# Patient Record
Sex: Female | Born: 1995 | Race: Black or African American | Hispanic: No | Marital: Single | Smoking: Current every day smoker
Health system: Southern US, Community
[De-identification: ages and names within clinical notes are randomized; demographics above are authoritative.]

## PROBLEM LIST (undated history)

## (undated) ENCOUNTER — Inpatient Hospital Stay (HOSPITAL_COMMUNITY): Payer: Self-pay

## (undated) DIAGNOSIS — D649 Anemia, unspecified: Secondary | ICD-10-CM

## (undated) DIAGNOSIS — Z720 Tobacco use: Secondary | ICD-10-CM

## (undated) DIAGNOSIS — T7840XA Allergy, unspecified, initial encounter: Secondary | ICD-10-CM

## (undated) DIAGNOSIS — Z789 Other specified health status: Secondary | ICD-10-CM

## (undated) DIAGNOSIS — L309 Dermatitis, unspecified: Secondary | ICD-10-CM

## (undated) DIAGNOSIS — Z91018 Allergy to other foods: Secondary | ICD-10-CM

## (undated) HISTORY — DX: Dermatitis, unspecified: L30.9

## (undated) HISTORY — DX: Allergy, unspecified, initial encounter: T78.40XA

## (undated) HISTORY — DX: Tobacco use: Z72.0

## (undated) HISTORY — PX: NO PAST SURGERIES: SHX2092

## (undated) HISTORY — DX: Anemia, unspecified: D64.9

## (undated) HISTORY — DX: Allergy to other foods: Z91.018

---

## 2013-07-23 ENCOUNTER — Encounter (HOSPITAL_COMMUNITY): Payer: Self-pay | Admitting: Emergency Medicine

## 2013-07-23 ENCOUNTER — Emergency Department (HOSPITAL_COMMUNITY)
Admission: EM | Admit: 2013-07-23 | Discharge: 2013-07-23 | Disposition: A | Discharge status: Home / Self Care | Payer: Medicaid - Out of State | Attending: Emergency Medicine | Admitting: Emergency Medicine

## 2013-07-23 DIAGNOSIS — R6884 Jaw pain: Secondary | ICD-10-CM | POA: Insufficient documentation

## 2013-07-23 DIAGNOSIS — M26609 Unspecified temporomandibular joint disorder, unspecified side: Secondary | ICD-10-CM | POA: Insufficient documentation

## 2013-07-23 MED ORDER — IBUPROFEN 800 MG PO TABS: 800.0000 mg | ORAL_TABLET | Freq: Three times a day (TID) | ORAL | Status: DC

## 2013-07-23 NOTE — ED Notes (Signed)
Pt ambulatory to exam room with steady gait. Pt with no acute distress.  

## 2013-07-23 NOTE — Discharge Instructions (Signed)
Temporomandibular Problems  Temporomandibular joint (TMJ) dysfunction means there are problems with the joint between your jaw and your skull. This is a joint lined by cartilage like other joints in your body but also has a small disc in the joint which keeps the bones from rubbing on each other. These joints are like other joints and can get inflamed (sore) from arthritis and other problems. When this joint gets sore, it can cause headaches and pain in the jaw and the face. CAUSES  Usually the arthritic types of problems are caused by soreness in the joint. Soreness in the joint can also be caused by overuse. This may come from grinding your teeth. It may also come from mis-alignment in the joint. DIAGNOSIS Diagnosis of this condition can often be made by history and exam. Sometimes your caregiver may need X-rays or an MRI scan to determine the exact cause. It may be necessary to see your dentist to determine if your teeth and jaws are lined up correctly. TREATMENT  Most of the time this problem is not serious; however, sometimes it can persist (become chronic). When this happens medications that will cut down on inflammation (soreness) help. Sometimes a shot of cortisone into the joint will be helpful. If your teeth are not aligned it may help for your dentist to make a splint for your mouth that can help this problem. If no physical problems can be found, the problem may come from tension. If tension is found to be the cause, biofeedback or relaxation techniques may be helpful. HOME CARE INSTRUCTIONS   Later in the day, applications of ice packs may be helpful. Ice can be used in a plastic bag with a towel around it to prevent frostbite to skin. This may be used about every 2 hours for 20 to 30 minutes, as needed while awake, or as directed by your caregiver.  Only take over-the-counter or prescription medicines for pain, discomfort, or fever as directed by your caregiver.  If physical therapy was  prescribed, follow your caregiver's directions.  Wear mouth appliances as directed if they were given. Document Released: 03/19/2001 Document Revised: 09/16/2011 Document Reviewed: 06/26/2008 [facility] Patient Information 2014 [place], [place].   Emergency Department Resource Guide 1) Find a Doctor and Pay Out of Pocket Although you won't have to find out who is covered by your insurance plan, it is a good idea to ask around and get recommendations. You will then need to call the office and see if the doctor you have chosen will accept you as a new patient and what types of options they offer for patients who are self-pay. Some doctors offer discounts or will set up payment plans for their patients who do not have insurance, but you will need to ask so you aren't surprised when you get to your appointment.  2) Contact Your Local Health Department Not all health departments have doctors that can see patients for sick visits, but many do, so it is worth a call to see if yours does. If you don't know where your local health department is, you can check in your phone book. The CDC also has a tool to help you locate your state's health department, and many state websites also have listings of all of their local health departments.  3) Find a Walk-in Clinic If your illness is not likely to be very severe or complicated, you may want to try a walk in clinic. These are popping up all over the country in pharmacies,  drugstores, and shopping centers. They're usually staffed by nurse practitioners or physician assistants that have been trained to treat common illnesses and complaints. They're usually fairly quick and inexpensive. However, if you have serious medical issues or chronic medical problems, these are probably not your best option.  No Primary Care Doctor: - Call Health Connect at  [phone] - they can help you locate a primary care doctor that  accepts your insurance, provides certain services,  etc. - Physician Referral Service- [phone]  Chronic Pain Problems: Organization         Address  Phone   Notes  [facility]  [phone] Patients need to be referred by their primary care doctor.   Medication Assistance: Organization         Address  Phone   Notes  [facility] Medication [facility] [address] [place], [place] [address] [phone] --Must be a resident of [facility] -- Must have NO insurance coverage whatsoever (no Medicaid/ Medicare, etc.) -- The pt. MUST have a primary care doctor that directs their care regularly and follows them in the community   [facility]  [phone]   [employer]  [phone]    Agencies that provide inexpensive medical care: Organization         Address  Phone   Notes  [facility]  [phone]   [facility]    [phone]   [facility] [address] [place], [place] [address] [phone]   [facility] [address] [place], [place] [phone]   Planned Parenthood    [phone]   [facility]    [phone]   [facility]  [address], [place] Phone:  [phone], Fax:  [phone] Hours of Operation:  9 am - 6 pm, M-F.  Also accepts Medicaid/Medicare and self-pay.  [facility]  [address], [place] Phone: [phone], Fax: [phone]. Hours of Operation:  8:30 am - 5:30 pm, M-F.  Also accepts Medicaid and self-pay.  [facility] [place] [address] [place] Phone: [phone]   [facility] [address] Natasha Bence [place], [place] [phone], Ext. [phone] Mondays & Thursdays: 7-9 AM.  First 15 patients are seen on a first come, first serve basis.    Medicaid-accepting [facility] Providers:  Organization         Address  Phone   Notes  [facility] [address],  [phone] Also accepts self-pay patients.  [facility] [address] Laurell Josephs [place], [place]  [phone]   [facility] [address], [place] [phone]   [facility] [address] [place] [phone]   Renaye Rakers [address], [place]   [phone] Only accepts [place] patients after they have their name applied to their card.   Self-Pay (no insurance) in [facility]:  Organization         Address  Phone   Notes  Sickle Cell Patients, [facility] [address] [place], [place] [phone]   [facility] [address] [place], [place] [phone]   [facility] [place]  [address], [place] [phone]   [facility]/Dr. Osei-Bonsu  [address], [place] or [address], [place] [phone] Phone number for both [employer] and [place] locations is the same.  [facility] [address] [place] [phone]   [facility] [address], [place] or [address] [phone]   [facility] [address] [place] [phone], phone; [phone], fax Sees patients 1st and 3rd Saturday of every month.  Must not qualify for public or private insurance (i.e. Medicaid, Medicare, [place] Health Choice, Veterans' Benefits)  Household income should be no more than 200% of the poverty level The clinic cannot treat you if you are pregnant or think you are pregnant  Sexually transmitted diseases are not treated at the clinic.    Dental Care: Organization         Address  Phone  Notes  [facility] Department of [facility] [address] [place], [place] [phone] Accepts children up to  age 76 who are enrolled in [place] or [place] Health Choice; pregnant women with a Medicaid card; and children who have applied for Medicaid or [place] Health Choice, but were declined, whose parents can pay a reduced fee at time of service.  [facility]  [address], [place] [phone] Accepts children up to age 26 who are enrolled in [place] or [place] Health Choice; pregnant women with a Medicaid card; and children who have applied for Medicaid or [place] Health Choice, but were declined, whose parents can pay a reduced fee at time of service.  [facility]  [address] [place], [place] [phone] Patients are seen by appointment only. Walk-ins are not accepted. [facility] will see patients 66 years of age and older. Monday - Tuesday (8am-5pm) Most Wednesdays (8:30-5pm) $30 per visit, cash only  [facility]  [address], [facility] [phone] Patients are seen by appointment only. Walk-ins are not accepted. [facility] will see patients 25 years of age and older. One Wednesday Evening (Monthly: Volunteer Based).  $30 per visit, cash only  [employer]  [phone] for adults; Children under age 32, call Graduate Pediatric Dentistry at [phone]. Children aged 49-14, please call [phone] to request a pediatric application.  Dental services are provided in all areas of dental care including fillings, crowns and bridges, complete and partial dentures, implants, gum treatment, root canals, and extractions. Preventive care is also provided. Treatment is provided to both adults and children. Patients are selected via a lottery and there is often a waiting list.   [facility] [address] [place]  [phone] [email or web address]   [facility] [address] [place], [place] [phone], Ext. [phone] Second and Fourth Thursday of  each month, opens at 6:30 AM; Clinic ends at 9 AM.  Patients are seen on a first-come first-served basis, and a limited number are seen during each clinic.   [facility]  [address] Ether Griffins [place], [place] [phone]   Eligibility Requirements You must have lived in [place], [place], or [place] counties for at least the last three months.   You cannot be eligible for state or federal sponsored [place], including [employer], [place], or [employer].   You generally cannot be  eligible for healthcare insurance through your employer.    How to apply: Eligibility screenings are held every Tuesday and Wednesday afternoon from 1:00 pm until 4:00 pm. You do not need an appointment for the interview!  [facility] [address], [place], [place] [phone]   [facility]  [phone]   [facility]  [phone]   [facility]  [phone]    Behavioral Health Resources in the Community: Intensive Outpatient Programs Organization         Address  Phone  Notes  [facility] [address], [place], [place] [phone]   [facility] [address], [place], [place] [phone]   [facility] [address], [place], [place]  [phone]   [facility] [address],  [place], [place] [phone] or [phone]   Substance Abuse Resources Organization         Address  Phone  Notes  [facility]  [phone]   [facility]  [phone]   [facility]  [phone]   [facility]  [phone]   Residential & Outpatient Substance Abuse Program  [phone]   Psychological Services Organization         Address  Phone  Notes  [facility]  [phone]- [phone]   [facility]  [phone]- [phone]   [facility] [address],  [place] [phone] or [phone]    Mobile Crisis Teams Organization         Address  Phone  Notes  [facility], Mobile Crisis Care Unit  [phone]   [facility]  [address] [place], [place] [phone]   Doristine LocksSharon DeEsch [address] [place] [phone]    Self-Help/Support Groups Organization         Address  Phone             Notes  [facility] [place] - variety of support groups  336- [phone] Call for more information  Narcotics Anonymous (NA), [facility] [address], [place]  2 meetings at this location   Statisticianesidential Treatment Programs Organization         Address  Phone  Notes  [facility] [address],    [place]  [phone]   [facility]  [address], [place] [phone], [place], [place] [phone]   [facility] [address], [place] [phone] Admissions: 8am-3pm M-F  [facility] [address]    [place], [place] [phone]   [facility] [address], [place], [place] [phone]   [facility] [address]  [place], [place] [phone]   [facility] - Intensive Outpatient [address], [place], [place] [phone]   [facility] ([facility] [address]  [place], [place] [phone] or [phone]   [facility] ([facility]) [address] [place], [place] [phone] Accepts Medicaid  [facility] [address]  [place] [phone] Substance Abuse/Addiction Treatment   [facility] Resources Organization         Address  Phone  Notes  [facility]  [phone](888) [phone]   Angie FavaJulie Brannon, PhD [address] [place], [place]   [phone] or [phone]   [facility]   [address] [place],  [place] [phone]     [facility] [address], [place], [place] [phone] Insurance/Medicaid/sponsorship through [employer] and Families [address]                                    [place], [place] [phone] Therapy/tele-psych/case  [facility] [address]   [place], [place] [phone]    Dr. Lolly Mustache  [phone]   [facility] [place]  [employer] [facility] Dept. 1) [address], [place] 2) [address], [place] 3)  [address], [place] [phone]  [phone]   [facility] Child Abuse Hotline [phone] or [phone] (After Hours)

## 2013-07-23 NOTE — ED Notes (Signed)
Per pt, states she has  medicaid and un able to go to Dentist/MD to have symptoms looked at-states jaw has been locking up for 3 months

## 2013-07-23 NOTE — ED Provider Notes (Signed)
CSN: [id number]     Arrival date & time 07/23/13  1553 History   First MD Initiated Contact with Patient 07/23/13 1655     Chief Complaint  Patient presents with  . jaw locks up    (Consider location/radiation/quality/duration/timing/severity/associated sxs/prior Treatment) HPI  Pt is a 18 yo female with complaints of her jaw locking up, causing pain on the left side of her jaw.  She states that it most often occurs when she yawns or is eating, and occasionally wakes up with it like that.  She denies any fever, chills, bowel or bladder changes, and states her LMP was 3 weeks ago.  She states she would have gone to a dentist, but [place] from [place], and has applied for Medicaid in [place], but has yet to hear back.  She also reports intermittent headaches, but states she does not wear her glasses or retainer.  History reviewed. No pertinent past medical history. No past surgical history on file. No family history on file. History  Substance Use Topics  . Smoking status: Never Smoker   . Smokeless tobacco: Not on file  . Alcohol Use: No   OB History   Grav Para Term Preterm Abortions TAB SAB Ect Mult Living                 Review of Systems  Constitutional: Negative for fever.  Skin: Negative for rash and wound.  Neurological: Negative for speech difficulty and numbness.    Allergies  Fish allergy and Shellfish allergy  Home Medications   Current Outpatient Rx  Name  Route  Sig  Dispense  Refill  . norelgestromin-ethinyl estradiol (ORTHO EVRA) 150-35 MCG/24HR transdermal patch   Transdermal   Place 1 patch onto the skin once a week.          BP 106/66  Pulse 82  Temp(Src) 98.6 F (37 C) (Oral)  Resp 16  SpO2 98%  LMP 07/03/2013 Physical Exam  General:  WDWN in NAD Psych:  Normal Mood and Affect Ears:  Mild Cerumen, TMs clear. Mouth:  Non erythematous, non edematous, uvula midline, no exudate present. Denied tenderness in teeth 1-32.  No obvious  caries. Jaw:  Obvious clicking and mild tenderness at Left TMJ.  Audible "pop" when patient yawns. No malocclusion.   ED Course  Procedures (including critical care time)  TMJ pain without trauma: capsule inflammation due to bite problems & muscle spasm Treatment: NSAIDs, soft diet, warm compresses Disposition: home with dental or oral surgery F/U  Labs Review Labs Reviewed - No data to display Imaging Review No results found.  EKG Interpretation   None       MDM   1. TMJ (temporomandibular joint disorder)    BP 106/66  Pulse 82  Temp(Src) 98.6 F (37 C) (Oral)  Resp 16  SpO2 98%  LMP 07/03/2013     Fayrene HelperBowie Saniah Schroeter, PA-C 07/23/13 1747

## 2013-07-24 NOTE — ED Provider Notes (Signed)
Medical screening examination/treatment/procedure(s) were performed by non-physician practitioner and as supervising physician I was immediately available for consultation/collaboration.  EKG Interpretation   None         Loys Shugars Y. Marquinn Meschke, MD 07/24/13 1321 

## 2013-10-06 ENCOUNTER — Encounter (HOSPITAL_COMMUNITY): Payer: Self-pay | Admitting: Emergency Medicine

## 2013-10-06 ENCOUNTER — Emergency Department (HOSPITAL_COMMUNITY)
Admission: EM | Admit: 2013-10-06 | Discharge: 2013-10-06 | Disposition: A | Discharge status: Home / Self Care | Payer: Medicaid - Out of State | Attending: Emergency Medicine | Admitting: Emergency Medicine

## 2013-10-06 DIAGNOSIS — K2970 Gastritis, unspecified, without bleeding: Secondary | ICD-10-CM | POA: Insufficient documentation

## 2013-10-06 DIAGNOSIS — R112 Nausea with vomiting, unspecified: Secondary | ICD-10-CM | POA: Insufficient documentation

## 2013-10-06 DIAGNOSIS — Z3202 Encounter for pregnancy test, result negative: Secondary | ICD-10-CM | POA: Insufficient documentation

## 2013-10-06 DIAGNOSIS — Z79899 Other long term (current) drug therapy: Secondary | ICD-10-CM | POA: Insufficient documentation

## 2013-10-06 DIAGNOSIS — K2990 Gastroduodenitis, unspecified, without bleeding: Principal | ICD-10-CM

## 2013-10-06 DIAGNOSIS — R109 Unspecified abdominal pain: Secondary | ICD-10-CM

## 2013-10-06 LAB — LIPASE, BLOOD: LIPASE: 19 U/L (ref 11–59)

## 2013-10-06 LAB — COMPREHENSIVE METABOLIC PANEL
ALT: 12 U/L (ref 0–35)
AST: 18 U/L (ref 0–37)
Albumin: 3.6 g/dL (ref 3.5–5.2)
Alkaline Phosphatase: 68 U/L (ref 39–117)
BUN: 9 mg/dL (ref 6–23)
CALCIUM: 9.5 mg/dL (ref 8.4–10.5)
CO2: 23 mEq/L (ref 19–32)
Chloride: 105 mEq/L (ref 96–112)
Creatinine, Ser: 0.67 mg/dL (ref 0.50–1.10)
GFR calc Af Amer: >90 mL/min (ref >90)
GFR calc non Af Amer: >90 mL/min (ref >90)
Glucose, Bld: 89 mg/dL (ref 70–99)
Potassium: 4 mEq/L (ref 3.7–5.3)
SODIUM: 139 meq/L (ref 137–147)
TOTAL PROTEIN: 7.3 g/dL (ref 6.0–8.3)
Total Bilirubin: 0.7 mg/dL (ref 0.3–1.2)

## 2013-10-06 LAB — CBC WITH DIFFERENTIAL/PLATELET
BASOS ABS: 0 10*3/uL (ref 0.0–0.1)
BASOS PCT: 0 % (ref 0–1)
EOS ABS: 0.2 10*3/uL (ref 0.0–0.7)
EOS PCT: 5 % (ref 0–5)
HCT: 37.5 % (ref 36.0–46.0)
Hemoglobin: 12.7 g/dL (ref 12.0–15.0)
LYMPHS ABS: 1.8 10*3/uL (ref 0.7–4.0)
Lymphocytes Relative: 40 % (ref 12–46)
MCH: 29.2 pg (ref 26.0–34.0)
MCHC: 33.9 g/dL (ref 30.0–36.0)
MCV: 86.2 fL (ref 78.0–100.0)
Monocytes Absolute: 0.3 10*3/uL (ref 0.1–1.0)
Monocytes Relative: 6 % (ref 3–12)
NEUTROS PCT: 49 % (ref 43–77)
Neutro Abs: 2.3 10*3/uL (ref 1.7–7.7)
PLATELETS: 210 10*3/uL (ref 150–400)
RBC: 4.35 MIL/uL (ref 3.87–5.11)
RDW: 12.7 % (ref 11.5–15.5)
WBC: 4.6 10*3/uL (ref 4.0–10.5)

## 2013-10-06 LAB — URINALYSIS, ROUTINE W REFLEX MICROSCOPIC
Bilirubin Urine: NEGATIVE
Glucose, UA: NEGATIVE mg/dL
Hgb urine dipstick: NEGATIVE
Ketones, ur: NEGATIVE mg/dL
Leukocytes, UA: NEGATIVE
Nitrite: NEGATIVE
Protein, ur: NEGATIVE mg/dL
SPECIFIC GRAVITY, URINE: 1.022 (ref 1.005–1.030)
UROBILINOGEN UA: 0.2 mg/dL (ref 0.0–1.0)
pH: 7 (ref 5.0–8.0)

## 2013-10-06 LAB — PREGNANCY, URINE: Preg Test, Ur: NEGATIVE

## 2013-10-06 MED ORDER — ONDANSETRON 4 MG PO TBDP
4.0000 mg | ORAL_TABLET | Freq: Once | ORAL | Status: AC
  Administered 2013-10-06: 4 mg via ORAL
  Filled 2013-10-06: qty 1

## 2013-10-06 MED ORDER — GI COCKTAIL ~~LOC~~
30.0000 mL | Freq: Once | ORAL | Status: AC
  Administered 2013-10-06: 30 mL via ORAL
  Filled 2013-10-06: qty 30

## 2013-10-06 MED ORDER — FAMOTIDINE 20 MG PO TABS: 20.0000 mg | ORAL_TABLET | Freq: Once | ORAL | Status: DC

## 2013-10-06 NOTE — ED Provider Notes (Signed)
CSN: [id number]     Arrival date & time 10/06/13  1200 History   First MD Initiated Contact with Patient 10/06/13 1212     Chief Complaint  Patient presents with  . Abdominal Pain     (Consider location/radiation/quality/duration/timing/severity/associated sxs/prior Treatment) HPI Comments: 18 y/o healthy female presents to the ED complaining of gradual onset abdominal pain beginning yesterday with associated nausea and two episodes of non-bloody emesis. Pain has been constant, cramping, located mid-epigastric, non-radiating. She has not tried any alleviating factors. Denies fever, chills, diarrhea. No sick contacts. She ate fettuccini alfredo last night and states she vomited it up this morning. LMP was 1 week ago and normal. Denies urinary symptoms, vaginal bleeding or discharge.  Patient is a 18 y.o. female presenting with abdominal pain. The history is provided by the patient.  Abdominal Pain Associated symptoms: nausea and vomiting     History reviewed. No pertinent past medical history. History reviewed. No pertinent past surgical history. History reviewed. No pertinent family history. History  Substance Use Topics  . Smoking status: Never Smoker   . Smokeless tobacco: Not on file  . Alcohol Use: No   OB History   Grav Para Term Preterm Abortions TAB SAB Ect Mult Living                 Review of Systems  Gastrointestinal: Positive for nausea, vomiting and abdominal pain.  All other systems reviewed and are negative.      Allergies  Fish allergy; Shellfish allergy; and Peanut-containing drug products  Home Medications   Current Outpatient Rx  Name  Route  Sig  Dispense  Refill  . norelgestromin-ethinyl estradiol (ORTHO EVRA) 150-35 MCG/24HR transdermal patch   Transdermal   Place 1 patch onto the skin once a week. Wednesday          BP 121/74  Pulse 82  Temp(Src) 98.5 F (36.9 C) (Oral)  Resp 18  SpO2 99%  LMP 09/23/2013 Physical Exam  Nursing note and  vitals reviewed. Constitutional: She is oriented to person, place, and time. She appears well-developed and well-nourished. No distress.  HENT:  Head: Normocephalic and atraumatic.  Mouth/Throat: Oropharynx is clear and moist.  Eyes: Conjunctivae are normal. No scleral icterus.  Neck: Normal range of motion. Neck supple.  Cardiovascular: Normal rate, regular rhythm and normal heart sounds.   Pulmonary/Chest: Effort normal and breath sounds normal.  Abdominal: Soft. Normal appearance and bowel sounds are normal. She exhibits no distension. There is no splenomegaly. There is tenderness in the epigastric area and left upper quadrant. There is no rigidity, no rebound and no guarding.  No peritoneal signs.  Musculoskeletal: Normal range of motion. She exhibits no edema.  Neurological: She is alert and oriented to person, place, and time.  Skin: Skin is warm and dry. She is not diaphoretic.  Psychiatric: She has a normal mood and affect. Her behavior is normal.    ED Course  Procedures (including critical care time) Labs Review Labs Reviewed  CBC WITH DIFFERENTIAL  COMPREHENSIVE METABOLIC PANEL  PREGNANCY, URINE  URINALYSIS, ROUTINE W REFLEX MICROSCOPIC  LIPASE, BLOOD   Imaging Review No results found.   EKG Interpretation None      MDM   Final diagnoses:  Gastritis  Abdominal pain   Pt presenting with abdominal pain, n/v. She is well appearing and in NAD, afebrile, normal VS. No peritoneal signs. Labs pending. Plan to give GI cocktail and zofran and re-assess. 1:50 PM Labs normal. Pt reports her  abdominal pain is present, however much improved. Nausea has subsided. Tolerates PO. Repeat abdominal exam abdomen still tender mid-epigastric, marked improvement from initial exam. Stable for d/c. She does not want prescription as her medicaid [place] out of state. Advised OTC prilosec or nexium. Return precautions given. Patient states understanding of treatment care plan and is  agreeable.   Trevor Mace, PA-C 10/06/13 1351

## 2013-10-06 NOTE — ED Notes (Signed)
Per pt, started having abdominal pain yesterday.  Pt states vomited x2 this am.  No diarrhea.  No fever.  Headache.

## 2013-10-06 NOTE — ED Notes (Signed)
Bed: WA03 Expected date:  Expected time:  Means of arrival:  Comments: 

## 2013-10-06 NOTE — ED Provider Notes (Signed)
Medical screening examination/treatment/procedure(s) were performed by non-physician practitioner and as supervising physician I was immediately available for consultation/collaboration.   Celene KrasJon R Deniesha Stenglein, MD 10/06/13 [phone]

## 2013-10-06 NOTE — Discharge Instructions (Signed)
Take over-the-counter prilosec or nexium for your pain. Follow up with one of the resources below to establish care with a primary care doctor.  Gastritis, Adult Gastritis is soreness and swelling (inflammation) of the lining of the stomach. Gastritis can develop as a sudden onset (acute) or long-term (chronic) condition. If gastritis is not treated, it can lead to stomach bleeding and ulcers. CAUSES  Gastritis occurs when the stomach lining is weak or damaged. Digestive juices from the stomach then inflame the weakened stomach lining. The stomach lining may be weak or damaged due to viral or bacterial infections. One common bacterial infection is the Helicobacter pylori infection. Gastritis can also result from excessive alcohol consumption, taking certain medicines, or having too much acid in the stomach.  SYMPTOMS  In some cases, there are no symptoms. When symptoms are present, they may include:  Pain or a burning sensation in the upper abdomen.  Nausea.  Vomiting.  An uncomfortable feeling of fullness after eating. DIAGNOSIS  Your caregiver may suspect you have gastritis based on your symptoms and a physical exam. To determine the cause of your gastritis, your caregiver may perform the following:  Blood or stool tests to check for the H pylori bacterium.  Gastroscopy. A thin, flexible tube (endoscope) is passed down the esophagus and into the stomach. The endoscope has a light and camera on the end. Your caregiver uses the endoscope to view the inside of the stomach.  Taking a tissue sample (biopsy) from the stomach to examine under a microscope. TREATMENT  Depending on the cause of your gastritis, medicines may be prescribed. If you have a bacterial infection, such as an H pylori infection, antibiotics may be given. If your gastritis is caused by too much acid in the stomach, H2 blockers or antacids may be given. Your caregiver may recommend that you stop taking aspirin, ibuprofen, or  other nonsteroidal anti-inflammatory drugs (NSAIDs). HOME CARE INSTRUCTIONS  Only take over-the-counter or prescription medicines as directed by your caregiver.  If you were given antibiotic medicines, take them as directed. Finish them even if you start to feel better.  Drink enough fluids to keep your urine clear or pale yellow.  Avoid foods and drinks that make your symptoms worse, such as:  Caffeine or alcoholic drinks.  Chocolate.  Peppermint or mint flavorings.  Garlic and onions.  Spicy foods.  Citrus fruits, such as oranges, lemons, or limes.  Tomato-based foods such as sauce, chili, salsa, and pizza.  Fried and fatty foods.  Eat small, frequent meals instead of large meals. SEEK IMMEDIATE MEDICAL CARE IF:   You have black or dark red stools.  You vomit blood or material that looks like coffee grounds.  You are unable to keep fluids down.  Your abdominal pain gets worse.  You have a fever.  You do not feel better after 1 week.  You have any other questions or concerns. MAKE SURE YOU:  Understand these instructions.  Will watch your condition.  Will get help right away if you are not doing well or get worse. Document Released: 06/18/2001 Document Revised: 12/24/2011 Document Reviewed: 08/07/2011 [facility] Patient Information 2014 [place], [place].  Abdominal Pain, Women Abdominal (stomach, pelvic, or belly) pain can be caused by many things. It is important to tell your doctor:  The location of the pain.  Does it come and go or is it present all the time?  Are there things that start the pain (eating certain foods, exercise)?  Are there other symptoms  associated with the pain (fever, nausea, vomiting, diarrhea)? All of this is helpful to know when trying to find the cause of the pain. CAUSES   Stomach: virus or bacteria infection, or ulcer.  Intestine: appendicitis (inflamed appendix), regional ileitis (Crohn's disease), ulcerative colitis  (inflamed colon), irritable bowel syndrome, diverticulitis (inflamed diverticulum of the colon), or cancer of the stomach or intestine.  Gallbladder disease or stones in the gallbladder.  Kidney disease, kidney stones, or infection.  Pancreas infection or cancer.  Fibromyalgia (pain disorder).  Diseases of the female organs:  Uterus: fibroid (non-cancerous) tumors or infection.  Fallopian tubes: infection or tubal pregnancy.  Ovary: cysts or tumors.  Pelvic adhesions (scar tissue).  Endometriosis (uterus lining tissue growing in the pelvis and on the pelvic organs).  Pelvic congestion syndrome (female organs filling up with blood just before the menstrual period).  Pain with the menstrual period.  Pain with ovulation (producing an egg).  Pain with an IUD (intrauterine device, birth control) in the uterus.  Cancer of the female organs.  Functional pain (pain not caused by a disease, may improve without treatment).  Psychological pain.  Depression. DIAGNOSIS  Your doctor will decide the seriousness of your pain by doing an examination.  Blood tests.  X-rays.  Ultrasound.  CT scan (computed tomography, special type of X-ray).  MRI (magnetic resonance imaging).  Cultures, for infection.  Barium enema (dye inserted in the large intestine, to better view it with X-rays).  Colonoscopy (looking in intestine with a lighted tube).  Laparoscopy (minor surgery, looking in abdomen with a lighted tube).  Major abdominal exploratory surgery (looking in abdomen with a large incision). TREATMENT  The treatment will depend on the cause of the pain.   Many cases can be observed and treated at home.  Over-the-counter medicines recommended by your caregiver.  Prescription medicine.  Antibiotics, for infection.  Birth control pills, for painful periods or for ovulation pain.  Hormone treatment, for endometriosis.  Nerve blocking injections.  Physical  therapy.  Antidepressants.  Counseling with a psychologist or psychiatrist.  Minor or major surgery. HOME CARE INSTRUCTIONS   Do not take laxatives, unless directed by your caregiver.  Take over-the-counter pain medicine only if ordered by your caregiver. Do not take aspirin because it can cause an upset stomach or bleeding.  Try a clear liquid diet (broth or water) as ordered by your caregiver. Slowly move to a bland diet, as tolerated, if the pain is related to the stomach or intestine.  Have a thermometer and take your temperature several times a day, and record it.  Bed rest and sleep, if it helps the pain.  Avoid sexual intercourse, if it causes pain.  Avoid stressful situations.  Keep your follow-up appointments and tests, as your caregiver orders.  If the pain does not go away with medicine or surgery, you may try:  Acupuncture.  Relaxation exercises (yoga, meditation).  Group therapy.  Counseling. SEEK MEDICAL CARE IF:   You notice certain foods cause stomach pain.  Your home care treatment is not helping your pain.  You need stronger pain medicine.  You want your IUD removed.  You feel faint or lightheaded.  You develop nausea and vomiting.  You develop a rash.  You are having side effects or an allergy to your medicine. SEEK IMMEDIATE MEDICAL CARE IF:   Your pain does not go away or gets worse.  You have a fever.  Your pain is felt only in portions of the abdomen. The  right side could possibly be appendicitis. The left lower portion of the abdomen could be colitis or diverticulitis.  You are passing blood in your stools (bright red or black tarry stools, with or without vomiting).  You have blood in your urine.  You develop chills, with or without a fever.  You pass out. MAKE SURE YOU:   Understand these instructions.  Will watch your condition.  Will get help right away if you are not doing well or get worse. Document Released:  04/21/2007 Document Revised: 09/16/2011 Document Reviewed: 05/11/2009 [facility] Patient Information 2014 [place], [place].  RESOURCE GUIDE  Chronic Pain Problems: Contact [facility]  [phone] Patients need to be referred by their primary care doctor.  Insufficient Money for Medicine: Contact [employer]:  call "211."   No Primary Care Doctor: - Call [facility]  [phone] - can help you locate a primary care doctor that  accepts your insurance, provides certain services, etc. - Physician Referral Service- [phone]  Agencies that provide inexpensive medical care: - [facility]  [phone] - [facility]  [phone] - [facility]  [phone] - [facility]  [phone] - [facility]  [phone] - [facility]  [phone]  Medicaid-accepting [facility] Providers: - [facility]- [address]  [phone], Mon-Fri 9am-7pm, Sat 9am-1pm - [facility]- [address] [place], [address] [place]  [phone] - [facility]- [address], [address] [place]  [phone] [facility]- [address]  [phone] - Renaye Rakers- [address] [place], [address], [phone]  Only accepts [place] patients after they have their name  applied to their card  Self Pay (no insurance) in [place]: - Sickle Cell Patients: Dr Willey Blade, [facility]  [address] [place], [phone] - [facility]- [address] [place]  [phone]       [facility] [place]- [address]       -     [facility]- see information above (Speak to [employer] if you do not have insurance)       -  [facility]- [address],  [phone]       -  [facility]- [address] [phone]       -  Dr Julio Sicks-  [address], [place], [phone]       -   [facility] - [address] [phone]       -  [facility]- [address] [phone], also [address] [phone]       -    [facility]- [address], [phone], 1st & 3rd Saturday        every month, 10am-1pm  1) Find a Doctor and Pay Out of Pocket Although you won't have to find out who is covered by your insurance plan, it is a good idea to ask around and get recommendations. You will then need to call the office and see if the doctor you have chosen will accept you as a new patient and what types of options they offer for patients who are self-pay. Some doctors offer discounts or will set up payment plans for their patients who do not have insurance,  but you will need to ask so you aren't surprised when you get to your appointment.  2) Contact Your Local Health Department Not all health departments have doctors that can see patients for sick visits, but many do, so it is worth a call to see if yours does. If you don't know where your local health department is, you can check in your phone book. The CDC also has a tool to help you locate your state's health department, and many state websites also have listings of all of their local health departments.  3) Find a Walk-in Clinic If your illness is not likely to be very severe or complicated, you may want to try a walk in clinic. These are popping up all over the country in pharmacies, drugstores, and shopping centers. They're usually staffed by nurse practitioners or physician assistants that have been trained to treat common illnesses and complaints. They're usually fairly quick and inexpensive. However, if you have serious medical issues or chronic medical problems, these are probably not your best option

## 2013-10-14 ENCOUNTER — Emergency Department (HOSPITAL_COMMUNITY)
Admission: EM | Admit: 2013-10-14 | Discharge: 2013-10-15 | Disposition: A | Discharge status: Home / Self Care | Payer: Medicaid - Out of State | Attending: Emergency Medicine | Admitting: Emergency Medicine

## 2013-10-14 ENCOUNTER — Encounter (HOSPITAL_COMMUNITY): Payer: Self-pay | Admitting: Emergency Medicine

## 2013-10-14 DIAGNOSIS — Y9389 Activity, other specified: Secondary | ICD-10-CM | POA: Insufficient documentation

## 2013-10-14 DIAGNOSIS — R062 Wheezing: Secondary | ICD-10-CM | POA: Insufficient documentation

## 2013-10-14 DIAGNOSIS — R1310 Dysphagia, unspecified: Secondary | ICD-10-CM | POA: Insufficient documentation

## 2013-10-14 DIAGNOSIS — Y929 Unspecified place or not applicable: Secondary | ICD-10-CM | POA: Insufficient documentation

## 2013-10-14 DIAGNOSIS — R000 Tachycardia, unspecified: Secondary | ICD-10-CM | POA: Insufficient documentation

## 2013-10-14 DIAGNOSIS — L299 Pruritus, unspecified: Secondary | ICD-10-CM | POA: Insufficient documentation

## 2013-10-14 DIAGNOSIS — T628X1A Toxic effect of other specified noxious substances eaten as food, accidental (unintentional), initial encounter: Secondary | ICD-10-CM | POA: Insufficient documentation

## 2013-10-14 DIAGNOSIS — T7840XA Allergy, unspecified, initial encounter: Secondary | ICD-10-CM

## 2013-10-14 MED ORDER — DIPHENHYDRAMINE HCL 50 MG/ML IJ SOLN
25.0000 mg | Freq: Once | INTRAMUSCULAR | Status: AC
  Administered 2013-10-14: 25 mg via INTRAVENOUS
  Filled 2013-10-14: qty 1

## 2013-10-14 MED ORDER — FAMOTIDINE IN NACL 20-0.9 MG/50ML-% IV SOLN
20.0000 mg | Freq: Once | INTRAVENOUS | Status: AC
  Administered 2013-10-14: 20 mg via INTRAVENOUS
  Filled 2013-10-14: qty 50

## 2013-10-14 MED ORDER — EPINEPHRINE 0.3 MG/0.3ML IJ SOAJ
INTRAMUSCULAR | Status: AC
  Filled 2013-10-14: qty 0.3

## 2013-10-14 MED ORDER — METHYLPREDNISOLONE SODIUM SUCC 125 MG IJ SOLR
125.0000 mg | Freq: Once | INTRAMUSCULAR | Status: AC
  Administered 2013-10-14: 125 mg via INTRAVENOUS
  Filled 2013-10-14: qty 2

## 2013-10-14 MED ORDER — SODIUM CHLORIDE 0.9 % IV BOLUS (SEPSIS)
1000.0000 mL | Freq: Once | INTRAVENOUS | Status: AC
  Administered 2013-10-14: 1000 mL via INTRAVENOUS

## 2013-10-14 MED ORDER — EPINEPHRINE 0.3 MG/0.3ML IJ SOAJ
0.3000 mg | Freq: Once | INTRAMUSCULAR | Status: AC
  Administered 2013-10-14: 0.3 mg via INTRAMUSCULAR

## 2013-10-14 NOTE — ED Notes (Signed)
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

## 2013-10-15 MED ORDER — EPINEPHRINE 0.3 MG/0.3ML IJ SOAJ: 0.3000 mg | INTRAMUSCULAR | Status: DC | PRN

## 2013-10-15 MED ORDER — PREDNISONE 50 MG PO TABS: 50.0000 mg | ORAL_TABLET | Freq: Every day | ORAL | Status: DC

## 2013-10-15 MED ORDER — DIPHENHYDRAMINE HCL 25 MG PO CAPS: 25.0000 mg | ORAL_CAPSULE | Freq: Four times a day (QID) | ORAL | Status: DC | PRN

## 2013-10-15 NOTE — Discharge Instructions (Signed)
We saw you in the ER after you had the allergic reaction.  The reaction is severe, however, it appears to be in control and there is no increased swelling or any difficulty in breathing noted. We are not sure what caused the reaction, and it is important for you to follow up with an allergist. Please take the medications prescribed. PLEASE RETURN TO THE ER IMMEDIATELY IN CASE YOU START HAVING WORSENING SWELLING, DIFFICULTY IN BREATHING ETC.  Epinephrine injection (Auto-injector) What is this medicine? EPINEPHRINE (ep i NEF rin) is used for the emergency treatment of severe allergic reactions. You should keep this medicine with you at all times. This medicine may be used for other purposes; ask your health care provider or pharmacist if you have questions. COMMON BRAND NAME(S): Adrenaclick, Auvi-Q, EpiPen, Twinject What should I tell my health care provider before I take this medicine? They need to know if you have any of the following conditions: -diabetes -heart disease -high blood pressure -lung or breathing disease, like asthma -Parkinson's disease -thyroid disease -an unusual or allergic reaction to epinephrine, sulfites, other medicines, foods, dyes, or preservatives -pregnant or trying to get pregnant -breast-feeding How should I use this medicine? This medicine is for injection into the outer thigh. Your doctor or health care professional will instruct you on the proper use of the device during an emergency. Read all directions carefully and make sure you understand them. Do not use more often than directed. Talk to your pediatrician regarding the use of this medicine in children. Special care may be needed. This drug is commonly used in children. A special device is available for use in children. Overdosage: If you think you have taken too much of this medicine contact a poison control center or emergency room at once. NOTE: This medicine is only for you. Do not share this medicine  with others. What if I miss a dose? This does not apply. You should only use this medicine for an allergic reaction. What may interact with this medicine? This medicine is only used during an emergency. Significant drug interactions are not likely during emergency use. This list may not describe all possible interactions. Give your health care provider a list of all the medicines, herbs, non-prescription drugs, or dietary supplements you use. Also tell them if you smoke, drink alcohol, or use illegal drugs. Some items may interact with your medicine. What should I watch for while using this medicine? Keep this medicine ready for use in the case of a severe allergic reaction. Make sure that you have the phone number of your doctor or health care professional and local hospital ready. Remember to check the expiration date of your medicine regularly. You may need to have additional units of this medicine with you at work, school, or other places. Talk to your doctor or health care professional about your need for extra units. Some emergencies may require an additional dose. Check with your doctor or a health care professional before using an extra dose. After use, go to the nearest hospital or call 911. Avoid physical activity. Make sure the treating health care professional knows you have received an injection of this medicine. You will receive additional instructions on what to do during and after use of this medicine before a medical emergency occurs. What side effects may I notice from receiving this medicine? Side effects that you should report to your doctor or health care professional as soon as possible: -allergic reactions like skin rash, itching or hives, swelling  of the face, lips, or tongue -breathing problems -chest pain -flushing -irregular or pounding heartbeat -numbness in fingers or toes -vomiting Side effects that usually do not require medical attention (report to your doctor or  health care professional if they continue or are bothersome): -anxiety or nervousness -dizzy, drowsy -dry mouth -headache -increased sweating -nausea -tired, weak This list may not describe all possible side effects. Call your doctor for medical advice about side effects. You may report side effects to FDA at 1-800-FDA-1088. Where should I keep my medicine? Keep out of the reach of children. Store at room temperature between 15 and 30 degrees C (59 and 86 degrees F). Protect from light and heat. The solution should be clear in color. If the solution is discolored or contains particles it must be replaced. Throw away any unused medicine after the expiration date. Ask your doctor or pharmacist about proper disposal of the injector if it is expired or has been used. Always replace your auto-injector before it expires. NOTE: This sheet is a summary. It may not cover all possible information. If you have questions about this medicine, talk to your doctor, pharmacist, or health care provider.  2014, [employer]. (2012-11-02 14:59:01)

## 2013-10-15 NOTE — ED Provider Notes (Signed)
CSN: [id number]     Arrival date & time 10/14/13  2322 History   First MD Initiated Contact with Patient 10/14/13 2333     Chief Complaint  Patient presents with  . 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  Patient is a 18 y.o. female presenting with allergic reaction. The history is provided by the patient.  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 No pertinent past medical history. History reviewed. No pertinent past surgical history. Family History  Problem Relation Age of Onset  . Hypertension Other   . Cancer Other    History  Substance Use Topics  . Smoking status: Never Smoker   . Smokeless tobacco: Not on file  . Alcohol Use: No   OB History   Grav Para Term Preterm Abortions TAB SAB Ect Mult Living                 Review of Systems  Constitutional: Positive for activity change.  HENT: Positive for trouble swallowing. Negative for drooling.   Respiratory: Positive for wheezing.   Cardiovascular: Negative for chest pain.  Gastrointestinal: Negative for abdominal pain.  Skin: Positive for itching. Negative for rash.  All other systems reviewed and are negative.     Allergies  Fish allergy; Shellfish allergy; and Peanut-containing drug products  Home Medications   Current Outpatient Rx  Name  Route  Sig  Dispense  Refill  . diphenhydrAMINE (BENADRYL) 25  mg capsule   Oral   Take 1 capsule (25 mg total) by mouth every 6 (six) hours as needed for itching.   30 capsule   0   . EPINEPHrine (EPIPEN) 0.3 mg/0.3 mL SOAJ injection   Intramuscular   Inject 0.3 mLs (0.3 mg total) into the muscle as needed.   2 Device   0   . predniSONE (DELTASONE) 50 MG tablet   Oral   Take 1 tablet (50 mg total) by mouth daily.   5 tablet   0    BP 97/58  Pulse 90  Temp(Src) 98.3 F (36.8 C) (Oral)  Resp 12  SpO2 99%  LMP 09/23/2013 Physical Exam  Vitals reviewed. Constitutional: She appears well-developed.  HENT:  Head: Atraumatic.  Eyes: Conjunctivae are normal.  Neck: Neck supple.  Cardiovascular:  tachycardic  Pulmonary/Chest: Effort normal. She has wheezes.  Abdominal: Soft.    ED Course  Procedures (including critical care time) Labs Review Labs Reviewed - No data to display Imaging Review No results found.   EKG Interpretation   Date/Time:  Thursday October 14 2013 23:48:40 EDT Ventricular Rate:  117 PR Interval:    QRS Duration: 87 QT Interval:  303 QTC Calculation: 423 R Axis:   66 Text Interpretation:  Sinus tachycardia Artifact in lead(s) I II aVR aVL  Confirmed by Rhunette Croft, MD, Janey Genta ([id number]) on 10/15/2013 12:18:43 AM      MDM   Final diagnoses:  Allergic  reaction    Pt comes in with cc of itching and wheezing and mild dib. Solumedrol, benadryl, pepcid and epi pen given (she feels that she has dib). Observed in the ER for 2 hours. Feels better, symptom free. Will d.c with return precautions.  Filed Vitals:   10/15/13 0145  BP: 97/58  Pulse: 90  Temp:   Resp: 12     Derwood KaplanAnkit Wladyslawa Disbro, MD 10/15/13 [phone]

## 2015-04-27 ENCOUNTER — Encounter (HOSPITAL_BASED_OUTPATIENT_CLINIC_OR_DEPARTMENT_OTHER): Payer: Self-pay | Admitting: *Deleted

## 2015-04-27 ENCOUNTER — Emergency Department (HOSPITAL_BASED_OUTPATIENT_CLINIC_OR_DEPARTMENT_OTHER)
Admission: EM | Admit: 2015-04-27 | Discharge: 2015-04-27 | Disposition: A | Discharge status: Home / Self Care | Payer: Medicaid - Out of State | Attending: Emergency Medicine | Admitting: Emergency Medicine

## 2015-04-27 DIAGNOSIS — R51 Headache: Secondary | ICD-10-CM | POA: Insufficient documentation

## 2015-04-27 DIAGNOSIS — Z3202 Encounter for pregnancy test, result negative: Secondary | ICD-10-CM | POA: Insufficient documentation

## 2015-04-27 DIAGNOSIS — R55 Syncope and collapse: Secondary | ICD-10-CM | POA: Insufficient documentation

## 2015-04-27 DIAGNOSIS — H538 Other visual disturbances: Secondary | ICD-10-CM | POA: Insufficient documentation

## 2015-04-27 DIAGNOSIS — R110 Nausea: Secondary | ICD-10-CM | POA: Insufficient documentation

## 2015-04-27 LAB — BASIC METABOLIC PANEL
Anion gap: 6 (ref 5–15)
BUN: 12 mg/dL (ref 6–20)
CHLORIDE: 107 mmol/L (ref 101–111)
CO2: 26 mmol/L (ref 22–32)
Calcium: 9.2 mg/dL (ref 8.9–10.3)
Creatinine, Ser: 0.76 mg/dL (ref 0.44–1.00)
GFR calc Af Amer: >60 mL/min (ref >60)
GFR calc non Af Amer: >60 mL/min (ref >60)
GLUCOSE: 75 mg/dL (ref 65–99)
POTASSIUM: 3.9 mmol/L (ref 3.5–5.1)
Sodium: 139 mmol/L (ref 135–145)

## 2015-04-27 LAB — CBC WITH DIFFERENTIAL/PLATELET
Basophils Absolute: 0 10*3/uL (ref 0.0–0.1)
Basophils Relative: 0 %
Eosinophils Absolute: 0.1 10*3/uL (ref 0.0–0.7)
Eosinophils Relative: 1 %
HCT: 34.8 % — ABNORMAL LOW (ref 36.0–46.0)
HEMOGLOBIN: 11.5 g/dL — AB (ref 12.0–15.0)
LYMPHS ABS: 1 10*3/uL (ref 0.7–4.0)
Lymphocytes Relative: 15 %
MCH: 29.3 pg (ref 26.0–34.0)
MCHC: 33 g/dL (ref 30.0–36.0)
MCV: 88.8 fL (ref 78.0–100.0)
Monocytes Absolute: 0.4 10*3/uL (ref 0.1–1.0)
Monocytes Relative: 5 %
NEUTROS PCT: 79 %
Neutro Abs: 5.5 10*3/uL (ref 1.7–7.7)
Platelets: 191 10*3/uL (ref 150–400)
RBC: 3.92 MIL/uL (ref 3.87–5.11)
RDW: 12.4 % (ref 11.5–15.5)
WBC: 7 10*3/uL (ref 4.0–10.5)

## 2015-04-27 LAB — CBG MONITORING, ED: Glucose-Capillary: 77 mg/dL (ref 65–99)

## 2015-04-27 LAB — PREGNANCY, URINE: Preg Test, Ur: NEGATIVE

## 2015-04-27 MED ORDER — SODIUM CHLORIDE 0.9 % IV BOLUS (SEPSIS)
1000.0000 mL | Freq: Once | INTRAVENOUS | Status: AC
  Administered 2015-04-27: 1000 mL via INTRAVENOUS

## 2015-04-27 MED ORDER — METHYLPREDNISOLONE SODIUM SUCC 125 MG IJ SOLR
125.0000 mg | Freq: Once | INTRAMUSCULAR | Status: AC
  Administered 2015-04-27: 125 mg via INTRAVENOUS
  Filled 2015-04-27: qty 2

## 2015-04-27 MED ORDER — METOCLOPRAMIDE HCL 5 MG/ML IJ SOLN
10.0000 mg | Freq: Once | INTRAMUSCULAR | Status: AC
  Administered 2015-04-27: 10 mg via INTRAVENOUS
  Filled 2015-04-27: qty 2

## 2015-04-27 NOTE — ED Provider Notes (Signed)
CSN: [id number]     Arrival date & time 04/27/15  1627 History   First MD Initiated Contact with Patient 04/27/15 1638     Chief Complaint  Patient presents with  . Dizziness     (Consider location/radiation/quality/duration/timing/severity/associated sxs/prior Treatment) HPI  Blood pressure 97/61, pulse 76, temperature 99 F (37.2 C), temperature source Oral, resp. rate 18, height 5\' 7"  (1.702 m), weight 120 lb (54.432 kg), last menstrual period 04/21/2015, SpO2 100 %.  Park PopeRaushanda Yetta BarreJones is a 19 y.o. female with no significant past medical history presents today with dizziness. She states that she was outside at work and got very hot. She went back inside and continued to feel hot and suddenly became dizzy. She describes the dizziness as the room spinning and being unstable on her feet. The patient went to the bathroom to sit down and rest but this only helped marginally. She reports associated nausea, headache, and blurred vision. Reports that recent fluid intake has been poor. Reports similar episode last year that was resolved with rest. Denies fever, chills, SOB, cough, chest pain, palpitations, abdominal pain, vomiting, diarrhea, weakness, numbness, or tingling.   History reviewed. No pertinent past medical history. History reviewed. No pertinent past surgical history. Family History  Problem Relation Age of Onset  . Hypertension Other   . Cancer Other    Social History  Substance Use Topics  . Smoking status: Never Smoker   . Smokeless tobacco: None  . Alcohol Use: No   OB History    No data available     Review of Systems  10 systems reviewed and found to be negative, except as noted in the HPI.   Allergies  Fish allergy; Shellfish allergy; and Peanut-containing drug products  Home Medications   Prior to Admission medications   Not on File   BP 97/61 mmHg  Pulse 76  Temp(Src) 99 F (37.2 C) (Oral)  Resp 18  Ht 5\' 7"  (1.702 m)  Wt 120 lb (54.432 kg)  BMI  18.79 kg/m2  SpO2 100%  LMP 04/21/2015 Physical Exam  Constitutional: She is oriented to person, place, and time. She appears well-developed and well-nourished. No distress.  HENT:  Head: Normocephalic.  Mouth/Throat: Oropharynx is clear and moist.  Eyes: Conjunctivae are normal.  Neck: Normal range of motion. No JVD present. No tracheal deviation present.  Cardiovascular: Normal rate, regular rhythm and intact distal pulses.   Radial pulse equal bilaterally  Pulmonary/Chest: Effort normal and breath sounds normal. No stridor. No respiratory distress. She has no wheezes. She has no rales. She exhibits no tenderness.  Abdominal: Soft. She exhibits no distension and no mass. There is no tenderness. There is no rebound and no guarding.  Musculoskeletal: Normal range of motion. She exhibits no edema or tenderness.  No calf asymmetry, superficial collaterals, palpable cords, edema, Homans sign negative bilaterally.    Neurological: She is alert and oriented to person, place, and time.  Skin: Skin is warm. She is not diaphoretic.  Psychiatric: She has a normal mood and affect.  Nursing note and vitals reviewed.   ED Course  Procedures (including critical care time) Labs Review Labs Reviewed  CBC WITH DIFFERENTIAL/PLATELET - Abnormal; Notable for the following:    Hemoglobin 11.5 (*)    HCT 34.8 (*)    All other components within normal limits  PREGNANCY, URINE  BASIC METABOLIC PANEL  CBG MONITORING, ED    Imaging Review No results found. I have personally reviewed and evaluated these images and  lab results as part of my medical decision-making.   EKG Interpretation   Date/Time:  Thursday April 27 2015 16:39:49 EDT Ventricular Rate:  72 PR Interval:  164 QRS Duration: 70 QT Interval:  386 QTC Calculation: 422 R Axis:   72 Text Interpretation:  Normal sinus rhythm Normal ECG Confirmed by DELO   MD, DOUGLAS ([id number]) on 04/27/2015 4:42:53 PM      MDM   Final diagnoses:   Pre-syncope    Filed Vitals:   04/27/15 1637  BP: 97/61  Pulse: 76  Temp: 99 F (37.2 C)  TempSrc: Oral  Resp: 18  Height:  (1.702 m)  Weight: 120 lb (54.432 kg)  SpO2: 100%    Medications  sodium chloride 0.9 % bolus 1,000 mL (1,000 mLs Intravenous New Bag/Given 04/27/15 1813)  metoCLOPramide (REGLAN) injection 10 mg (10 mg Intravenous Given 04/27/15 1817)  methylPREDNISolone sodium succinate (SOLU-MEDROL) 125 mg/2 mL injection 125 mg (125 mg Intravenous Given 04/27/15 1815)    Tagan Bartram is 19 y.o. female presenting with resolved lightheadedness and blurred vision, presyncopal sensation. Patient did not syncopized, there was no associated abdominal pain, chest pain, shortness of breath. EKG with no arrhythmia, blood work reassuring no significant anemia, pregnancy negative. As per patient symptoms have completely resolved. Vital signs are not orthostatic. Encourage her to push fluids. Patient states she has a primary care doctor on her Medicaid card but has not visited with him yet. I've encouraged her to make a appointment. We've had an extensive discussion of return precautions patient verbalizes her understanding.  Evaluation does not show pathology that would require ongoing emergent intervention or inpatient treatment. Pt is hemodynamically stable and mentating appropriately. Discussed findings and plan with patient/guardian, who agrees with care plan. All questions answered. Return precautions discussed and outpatient follow up given.     Wynetta Emery, PA-C 04/27/15 1839  Geoffery Lyons, MD 04/27/15 2019

## 2015-04-27 NOTE — ED Notes (Signed)
PA at bedside.

## 2015-04-27 NOTE — ED Notes (Signed)
Patient here with complaint of dizziness that started acutley this afternoon. Arrived by EMS with IV established and had received NS 250cc BOLUS for BP of 80. Alert and oriented, no neuro deficits

## 2015-04-27 NOTE — ED Notes (Signed)
Standing at work and got dizzy. Felt like she was going to pass out. EMS was called and transported her here. Headache.

## 2015-04-27 NOTE — ED Notes (Signed)
EMT at bedside obtaining blood draw

## 2015-04-27 NOTE — ED Notes (Signed)
Pt given snack of PB, crackers, water

## 2015-04-27 NOTE — Discharge Instructions (Signed)
Do not hesitate to return to the emergency room for any new, worsening or concerning symptoms.  Please obtain primary care using resource guide below. Let them know that you were seen in the emergency room and that they will need to obtain records for further outpatient management.   Near-Syncope Near-syncope (commonly known as near fainting) is sudden weakness, dizziness, or feeling like you might pass out. During an episode of near-syncope, you may also develop pale skin, have tunnel vision, or feel sick to your stomach (nauseous). Near-syncope may occur when getting up after sitting or while standing for a long time. It is caused by a sudden decrease in blood flow to the brain. This decrease can result from various causes or triggers, most of which are not serious. However, because near-syncope can sometimes be a sign of something serious, a medical evaluation is required. The specific cause is often not determined. HOME CARE INSTRUCTIONS  Monitor your condition for any changes. The following actions may help to alleviate any discomfort you are experiencing:  Have someone stay with you until you feel stable.  Lie down right away and prop your feet up if you start feeling like you might faint. Breathe deeply and steadily. Wait until all the symptoms have passed. Most of these episodes last only a few minutes. You may feel tired for several hours.   Drink enough fluids to keep your urine clear or pale yellow.   If you are taking blood pressure or heart medicine, get up slowly when seated or lying down. Take several minutes to sit and then stand. This can reduce dizziness.  Follow up with your health care provider as directed. SEEK IMMEDIATE MEDICAL CARE IF:   You have a severe headache.   You have unusual pain in the chest, abdomen, or back.   You are bleeding from the mouth or rectum, or you have black or tarry stool.   You have an irregular or very fast heartbeat.   You have  repeated fainting or have seizure-like jerking during an episode.   You faint when sitting or lying down.   You have confusion.   You have difficulty walking.   You have severe weakness.   You have vision problems.  MAKE SURE YOU:   Understand these instructions.  Will watch your condition.  Will get help right away if you are not doing well or get worse.   This information is not intended to replace advice given to you by your health care provider. Make sure you discuss any questions you have with your health care provider.   Document Released: 06/24/2005 Document Revised: 06/29/2013 Document Reviewed: 11/27/2012 Elsevier Interactive Patient Education 2016 [employer].   Emergency Department Resource Guide 1) Find a Doctor and Pay Out of Pocket Although you won't have to find out who is covered by your insurance plan, it is a good idea to ask around and get recommendations. You will then need to call the office and see if the doctor you have chosen will accept you as a new patient and what types of options they offer for patients who are self-pay. Some doctors offer discounts or will set up payment plans for their patients who do not have insurance, but you will need to ask so you aren't surprised when you get to your appointment.  2) Contact Your Local Health Department Not all health departments have doctors that can see patients for sick visits, but many do, so it is worth a call to  see if yours does. If you don't know where your local health department is, you can check in your phone book. The CDC also has a tool to help you locate your state's health department, and many state websites also have listings of all of their local health departments.  3) Find a Walk-in Clinic If your illness is not likely to be very severe or complicated, you may want to try a walk in clinic. These are popping up all over the country in pharmacies, drugstores, and shopping centers. They're  usually staffed by nurse practitioners or physician assistants that have been trained to treat common illnesses and complaints. They're usually fairly quick and inexpensive. However, if you have serious medical issues or chronic medical problems, these are probably not your best option.  No Primary Care Doctor: - Call Health Connect at  [phone] - they can help you locate a primary care doctor that  accepts your insurance, provides certain services, etc. - Physician Referral Service- [phone]  Chronic Pain Problems: Organization         Address  Phone   Notes  [facility]  [phone] Patients need to be referred by their primary care doctor.   Medication Assistance: Organization         Address  Phone   Notes  [facility] Medication [facility] [address] [place], [place] [address] [phone] --Must be a resident of [place] [facility] -- Must have NO insurance coverage whatsoever (no Medicaid/ Medicare, etc.) -- The pt. MUST have a primary care doctor that directs their care regularly and follows them in the community   MedAssist  [phone]   [employer]  [phone]    Agencies that provide inexpensive medical care: Organization         Address  Phone   Notes  [facility]  [phone]   [facility]    [phone]   [facility] [address] [place], [place] [address] [phone]   [facility] [address], [place] [phone]   [facility]anned [facility]    [phone]) [phone]   [facility]    [phone]   [facility]  [address], [place] Phone:  [phone], Fax:  [phone] Hours of Operation:  9 am - 6 pm, M-F.  Also accepts Medicaid/Medicare and self-pay.  [facility]  [address], [place] Phone: [phone]) [phone], Fax: [phone]. Hours  of Operation:  8:30 am - 5:30 pm, M-F.  Also accepts Medicaid and self-pay.  [facility] [place] [address], [place] Phone: [phone]) [phone]   [facility] [address], [place], [place] [phone], Ext. [phone] Mondays & Thursdays: 7-9 AM.  First 15 patients are seen on a first come, first serve basis.    Medicaid-accepting [facility] Providers:  Organization         Address  Phone   Notes  [facility] [address],  [phone] Also accepts self-pay patients.  [facility] [address], [place]  [phone]   [facility] [address], [place] [phone]   [facility] [address] [place] [address], [place] [phone]   Renaye RakersVeita Bland [address], [place]   [phone] Only accepts [place] Access Medicaid patients after they have their name applied to their card.   Self-Pay (no insurance) in [facility]:  Organization         Address  Phone   Notes  Sickle Cell Patients, [facility] [address] [place], [place] [phone]   [facility] [address] [place], [place] [phone]   [facility] [place]  [address], [place] [phone]   [facility]/Dr. Osei-Bonsu  [address], [place] or [address], [place] [phone] Phone number for both [place] and [place] locations is the same.  [facility] [address] [place] [phone]   [facility] [address], [place] or [address] [phone]   [facility] [address] [place] [phone], phone; [phone], fax Sees patients 1st and 3rd Saturday of every month.  Must not qualify for public or private insurance (i.e. Medicaid,  Medicare, [place] Health Choice, Veterans' Benefits)  Household income should be no more than 200% of the poverty level The clinic cannot treat you if you are pregnant or think you are pregnant  Sexually transmitted diseases are not treated at the clinic.    Dental Care: Organization         Address  Phone  Notes  [facility] Department of [facility] [address] [place], [place] [phone] Accepts children up to age 45 who are enrolled in [place] or [place] Health Choice; pregnant women with a Medicaid card; and children who have applied for Medicaid or [place] Health Choice, but were declined, whose parents can pay a reduced fee at time of service.  [facility] Department of [facility]  [address], [place] [phone] Accepts children up to age 81 who are enrolled in [place] or [place] Health Choice; pregnant women with a Medicaid card; and children who have applied for Medicaid or [place] Health Choice, but were declined, whose parents can pay a reduced fee at time of service.  [facility]  [address] [place], [place] [phone] Patients are seen by appointment only. Walk-ins are not accepted. [facility] will see patients 66 years of age and older. Monday - Tuesday (8am-5pm) Most Wednesdays (8:30-5pm) $30 per visit, cash only  [facility] Adult Dental Access PROGRAM  [address], [facility] [phone] Patients are seen by appointment only. Walk-ins are not accepted. [facility] will see patients 50 years of age and older. One Wednesday Evening (Monthly: Volunteer Based).  $30 per visit, cash only  [employer]  [phone] for adults; Children under age 30, call Graduate Pediatric Dentistry at [phone]. Children aged 49-14, please call [phone] to request a pediatric application.  Dental services are provided in all areas of dental care including fillings, crowns  and bridges, complete and partial dentures, implants, gum treatment, root canals, and extractions. Preventive care is also provided. Treatment is provided to both adults and children. Patients are selected via a lottery and there is often a waiting list.   [facility] [place] [address], [place]  [phone] [email or web address]   [facility] [address] [place], [place] [phone], Ext. [phone] Second and Fourth Thursday of each month, opens at 6:30  AM; Clinic ends at 9 AM.  Patients are seen on a first-come first-served basis, and a limited number are seen during each clinic.   [facility]  [address] Hillard Danker [place], [place] [phone]   Eligibility Requirements You must have lived in [place], [place], or [place] counties for at least the last three months.   You cannot be eligible for state or federal sponsored [employer], including [employer], [place], or [employer].   You generally cannot be eligible for healthcare insurance through your employer.    How to apply: Eligibility screenings are held every Tuesday and Wednesday afternoon from 1:00 pm until 4:00 pm. You do not need an appointment for the interview!  [facility] [address], [place], [place]   [place]  [place] Department  [place]  [phone]    Behavioral Health Resources in the Community: Intensive Outpatient Programs Organization         Address  Phone  Notes  [place]. [address] [place], [place] [phone]   [facility] Outpatient [address] [place], [place]   ADS: Alcohol & Drug S[facility]cs [address], [place], [place]   [place] [address]  [place], [place] or [phone]   Substance Abuse  Resources Organization         Address  Phone  Notes  Alcohol and Drug Services  [phone]   [place]  [phone]   The [place]   Chinita Pester  [phone]   Residential & Outpatient Substance Abuse Program  [phone]   Psychological Services Organization         Address  Phone  Notes  [facility] [place]  [place]  [phone]   [place] [address] [place] or [phone]    Mobile Crisis Teams Organization         Address  Phone  Notes  [facility], Mobile Crisis Care Unit  [phone]   [facility]  [address] [place], [place]   [place] Levels [address], [place] [phone]    Self-Help/Support Groups Organization         Address  Phone             Notes  [place] of [place] - variety of support groups  [place] Call for more information  Narcotics Anonymous (NA), [facility] [address], [employer] [place]  2 meetings at this location   Special educational needs teacher         Address  Phone  Notes  [facility] Residential Treatment [place],    [place]  [phone]   [facility]  [address] [place] 242683, [place], [place]   [place], [place] [phone] Admissions: 8am-3pm M-F  Incentives Substance [place] [address],    [place], [place] [phone]   [facility] [address] Jadene Pierini [place], [place]   [facility] [address],  [place], [place]   [facility] Programs - Intensive Outpatient [address], [place], [place]   [facility] [address] [place], [place] or [phone]  [facility] ([facility]S) [address] [place], [place] [phone] Accepts Medicaid  °[facility] [address]  °[place] [phone] Substance Abuse/Addiction Treatment  ° °[place] Behavioral Health Resources °Organization         Address  Phone  Notes  °[facility]  [phone]) [phone]   °Julie Brannon, PhD [address] [place], [place]   ([phone] or [phone]   °[facility]   [address] °[place], [place] [phone]   °[facility] [address], [place], [place] [phone] Insurance/Medicaid/sponsorship through [facility]  °[facility] [address]                                    [place], [place] [phone] Therapy/tele-psych/case  °[facility] [address]  ° [place], [place] [phone]    °Dr. Arfeen  ([phone]   °[facility] [place]  [employer] [place] [facility] 1) [address], [place] °2) [address], [place] °3)  [address], [place] [phone]) [phone] °([phone]) [phone] ° °([phone]) [phone]   °[place] Child Abuse Hotline (336) 342-1394 or ([phone]) [phone] (After Hours)    ° ° ° °

## 2015-04-27 NOTE — ED Notes (Signed)
D/c home with family. Ambulatory with steady gait at d/c.

## 2016-03-24 ENCOUNTER — Emergency Department (HOSPITAL_COMMUNITY): Payer: Medicaid - Out of State

## 2016-03-24 ENCOUNTER — Encounter (HOSPITAL_COMMUNITY): Payer: Self-pay

## 2016-03-24 ENCOUNTER — Emergency Department (HOSPITAL_COMMUNITY)
Admission: EM | Admit: 2016-03-24 | Discharge: 2016-03-25 | Disposition: A | Discharge status: Home / Self Care | Payer: Medicaid - Out of State | Attending: Emergency Medicine | Admitting: Emergency Medicine

## 2016-03-24 DIAGNOSIS — IMO0002 Reserved for concepts with insufficient information to code with codable children: Secondary | ICD-10-CM

## 2016-03-24 DIAGNOSIS — Y929 Unspecified place or not applicable: Secondary | ICD-10-CM | POA: Insufficient documentation

## 2016-03-24 DIAGNOSIS — W25XXXA Contact with sharp glass, initial encounter: Secondary | ICD-10-CM | POA: Insufficient documentation

## 2016-03-24 DIAGNOSIS — Y939 Activity, unspecified: Secondary | ICD-10-CM | POA: Insufficient documentation

## 2016-03-24 DIAGNOSIS — S91115A Laceration without foreign body of left lesser toe(s) without damage to nail, initial encounter: Secondary | ICD-10-CM | POA: Insufficient documentation

## 2016-03-24 DIAGNOSIS — Z91010 Allergy to peanuts: Secondary | ICD-10-CM | POA: Insufficient documentation

## 2016-03-24 DIAGNOSIS — Z23 Encounter for immunization: Secondary | ICD-10-CM | POA: Insufficient documentation

## 2016-03-24 DIAGNOSIS — Y999 Unspecified external cause status: Secondary | ICD-10-CM | POA: Insufficient documentation

## 2016-03-24 MED ORDER — TETANUS-DIPHTH-ACELL PERTUSSIS 5-2.5-18.5 LF-MCG/0.5 IM SUSP
0.5000 mL | Freq: Once | INTRAMUSCULAR | Status: AC
  Administered 2016-03-24: 0.5 mL via INTRAMUSCULAR
  Filled 2016-03-24: qty 0.5

## 2016-03-24 MED ORDER — LIDOCAINE HCL (PF) 1 % IJ SOLN
5.0000 mL | Freq: Once | INTRAMUSCULAR | Status: AC
Start: 2016-03-24 — End: 2016-03-24
  Administered 2016-03-24: 5 mL
  Filled 2016-03-24: qty 5

## 2016-03-24 NOTE — ED Triage Notes (Signed)
Patient here with laceration to left foot, 3rd toe after stepping on glass. Bleeding controlled with dressing
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
## 2016-03-25 MED ORDER — BACITRACIN ZINC 500 UNIT/GM EX OINT: 1.0000 "application " | TOPICAL_OINTMENT | Freq: Two times a day (BID) | CUTANEOUS | Status: DC

## 2016-03-25 MED ORDER — CEPHALEXIN 500 MG PO CAPS: 500.0000 mg | ORAL_CAPSULE | Freq: Four times a day (QID) | ORAL | 0 refills | Status: DC

## 2016-03-25 MED ORDER — HYDROCODONE-ACETAMINOPHEN 5-325 MG PO TABS: 1.0000 | ORAL_TABLET | Freq: Four times a day (QID) | ORAL | 0 refills | Status: DC | PRN

## 2016-03-25 NOTE — Discharge Instructions (Signed)
Your sutures will dissolve in 7-10 days.    Return for redness, fever, or pus.  Please follow-up with your primary care doctor.

## 2018-02-02 ENCOUNTER — Ambulatory Visit (INDEPENDENT_AMBULATORY_CARE_PROVIDER_SITE_OTHER): Payer: BLUE CROSS/BLUE SHIELD | Admitting: Obstetrics and Gynecology

## 2018-02-02 ENCOUNTER — Encounter: Payer: Self-pay | Admitting: Obstetrics and Gynecology

## 2018-02-02 DIAGNOSIS — N898 Other specified noninflammatory disorders of vagina: Secondary | ICD-10-CM

## 2018-02-02 DIAGNOSIS — Z3480 Encounter for supervision of other normal pregnancy, unspecified trimester: Secondary | ICD-10-CM | POA: Diagnosis not present

## 2018-02-02 DIAGNOSIS — Z3481 Encounter for supervision of other normal pregnancy, first trimester: Secondary | ICD-10-CM

## 2018-02-02 DIAGNOSIS — Z3490 Encounter for supervision of normal pregnancy, unspecified, unspecified trimester: Secondary | ICD-10-CM | POA: Insufficient documentation

## 2018-02-02 MED ORDER — VITAFOL GUMMIES 3.33-0.333-34.8 MG PO CHEW: 2.0000 | CHEWABLE_TABLET | Freq: Every day | ORAL | 6 refills | Status: AC

## 2018-02-02 MED ORDER — FAMOTIDINE 20 MG PO TABS: 20.0000 mg | ORAL_TABLET | Freq: Two times a day (BID) | ORAL | 3 refills | Status: DC

## 2018-02-02 NOTE — Progress Notes (Signed)
  Subjective:    Cindy Levine is a G1P0 1070w6d being seen today for her first obstetrical visit.  Her obstetrical history is significant for first pregnancy. Patient does intend to breast feed. Pregnancy history fully reviewed.  Patient reports no complaints.  Vitals:   02/02/18 1034  BP: 111/71  Pulse: 100  Weight: 133 lb 9.6 oz (60.6 kg)    HISTORY: OB History  Gravida Para Term Preterm AB Living  1            SAB TAB Ectopic Multiple Live Births               # Outcome Date GA Lbr Len/2nd Weight Sex Delivery Anes PTL Lv  1 Current            History reviewed. No pertinent past medical history. History reviewed. No pertinent surgical history. Family History  Problem Relation Age of Onset  . Diabetes Father   . Prostate cancer Maternal Grandfather   . Hypertension Maternal Grandfather   . Hypertension Paternal Grandmother   . Diabetes Paternal Grandmother      Exam    Uterus:   14-weeks  Pelvic Exam:    Perineum: No Hemorrhoids, Normal Perineum   Vulva: normal   Vagina:  normal mucosa, normal discharge   pH:    Cervix: nulliparous appearance and cervix is closed and long   Adnexa: no mass, fullness, tenderness   Bony Pelvis: gynecoid  System: Breast:  normal appearance, no masses or tenderness   Skin: normal coloration and turgor, no rashes    Neurologic: oriented, no focal deficits   Extremities: normal strength, tone, and muscle mass   HEENT extra ocular movement intact   Mouth/Teeth mucous membranes moist, pharynx normal without lesions and dental hygiene good   Neck supple and no masses   Cardiovascular: regular rate and rhythm   Respiratory:  appears well, vitals normal, no respiratory distress, acyanotic, normal RR, chest clear, no wheezing, crepitations, rhonchi, normal symmetric air entry   Abdomen: soft, non-tender; bowel sounds normal; no masses,  no organomegaly   Urinary:       Assessment:    Pregnancy: G1P0 Patient Active Problem List    Diagnosis Date Noted  . Encounter for supervision of normal pregnancy, antepartum 02/02/2018        Plan:     Initial labs drawn. Prenatal vitamins. Problem list reviewed and updated. Genetic Screening discussed : Panorama ordered.  Ultrasound discussed; fetal survey: ordered. Rx pepcid provided  Follow up in 4 weeks. 50% of 30 min visit spent on counseling and coordination of care.     Srishti Strnad 02/02/2018

## 2018-02-02 NOTE — Progress Notes (Addendum)
Pt presents for initial NOB. This is not a planned pregnancy and she's living with FOB. Pt c/o "cottage cheese" discharge.

## 2018-02-02 NOTE — Patient Instructions (Addendum)
Thinking About Doren Custard???  You must attend a Doren Custard class at [facility]  3rd Wednesday of every month from 7-9pm  Free  [employer] by calling [phone] or online at [email or web address]  Bring [place] the certificate from the class  Doren Custard supplies needed for [facility] patients:  Our practice has a Heritage manager in a Box tub at the hospital that you can borrow  You will need to purchase an accessory kit that has all needed supplies through [facility] [phone]) or online $175.00  Or you can purchase the supplies separately: o Single-use disposable tub liner for Birth Pool in a Box (REGULAR size) o New garden hose labeled "lead-free", "suitable for drinking water", o Electric drain pump to remove water (We recommend 792 gallon per hour or greater pump.)  o  "non-toxic" OR "water potable" o Garden hose to remove the dirty water o Fish net o Bathing suit top (optional) o Long-handled mirror (optional)  [employer] sells tubs for ~ $120 if you would rather purchase your own tub.  They also sell accessories, liners.    Www.waterbirthsolutions.com for tub purchases and supplies  [employer] (www.[email or web address]) $275 for tub rental/set-up & take down/kit   [employer] information regarding doulas (labor support) who provide pool rentals:  IdentityList.se.htm   [employer] (www.thelaborladies.com)  IdentityList.se.htm   Things that would prevent you from having a waterbirth:  Premature, <37wks  Previous cesarean birth  Presence of thick meconium-stained fluid  Multiple gestation (Twins, triplets, etc.)  Uncontrolled diabetes or gestational diabetes requiring medication  Hypertension  Heavy vaginal bleeding  Non-reassuring fetal heart rate  Active infection (MRSA, etc.)  If your labor has to be induced and induction method requires continuous  monitoring of the baby's heart rate  Other risks/issues identified by your obstetrical provider    Second Trimester of Pregnancy The second trimester is from week 14 through week 27 (months 4 through 6). The second trimester is often a time when you feel your best. Your body has adjusted to being pregnant, and you begin to feel better physically. Usually, morning sickness has lessened or quit completely, you may have more energy, and you may have an increase in appetite. The second trimester is also a time when the fetus is growing rapidly. At the end of the sixth month, the fetus is about 9 inches long and weighs about 1 pounds. You will likely begin to feel the baby move (quickening) between 16 and 20 weeks of pregnancy. Body changes during your second trimester Your body continues to go through many changes during your second trimester. The changes vary from woman to woman.  Your weight will continue to increase. You will notice your lower abdomen bulging out.  You may begin to get stretch marks on your hips, abdomen, and breasts.  You may develop headaches that can be relieved by medicines. The medicines should be approved by your health care provider.  You may urinate more often because the fetus is pressing on your bladder.  You may develop or continue to have heartburn as a result of your pregnancy.  You may develop constipation because certain hormones are causing the muscles that push waste through your intestines to slow down.  You may develop hemorrhoids or swollen, bulging veins (varicose veins).  You may have back pain. This is caused by: ? Weight gain. ? Pregnancy hormones that are relaxing the joints in your pelvis. ? A shift in weight and the muscles that support your  balance.  Your breasts will continue to grow and they will continue to become tender.  Your gums may bleed and may be sensitive to brushing and flossing.  Dark spots or blotches (chloasma, mask of  pregnancy) may develop on your face. This will likely fade after the baby is born.  A dark line from your belly button to the pubic area (linea nigra) may appear. This will likely fade after the baby is born.  You may have changes in your hair. These can include thickening of your hair, rapid growth, and changes in texture. Some women also have hair loss during or after pregnancy, or hair that feels dry or thin. Your hair will most likely return to normal after your baby is born.  What to expect at prenatal visits During a routine prenatal visit:  You will be weighed to make sure you and the fetus are growing normally.  Your blood pressure will be taken.  Your abdomen will be measured to track your baby's growth.  The fetal heartbeat will be listened to.  Any test results from the previous visit will be discussed.  Your health care provider may ask you:  How you are feeling.  If you are feeling the baby move.  If you have had any abnormal symptoms, such as leaking fluid, bleeding, severe headaches, or abdominal cramping.  If you are using any tobacco products, including cigarettes, chewing tobacco, and electronic cigarettes.  If you have any questions.  Other tests that may be performed during your second trimester include:  Blood tests that check for: ? Low iron levels (anemia). ? High blood sugar that affects pregnant women (gestational diabetes) between 74 and 28 weeks. ? Rh antibodies. This is to check for a protein on red blood cells (Rh factor).  Urine tests to check for infections, diabetes, or protein in the urine.  An ultrasound to confirm the proper growth and development of the baby.  An amniocentesis to check for possible genetic problems.  Fetal screens for spina bifida and Down syndrome.  HIV (human immunodeficiency virus) testing. Routine prenatal testing includes screening for HIV, unless you choose not to have this test.  Follow these instructions at  home: Medicines  Follow your health care provider's instructions regarding medicine use. Specific medicines may be either safe or unsafe to take during pregnancy.  Take a prenatal vitamin that contains at least 600 micrograms (mcg) of folic acid.  If you develop constipation, try taking a stool softener if your health care provider approves. Eating and drinking  Eat a balanced diet that includes fresh fruits and vegetables, whole grains, good sources of protein such as meat, eggs, or tofu, and low-fat dairy. Your health care provider will help you determine the amount of weight gain that is right for you.  Avoid raw meat and uncooked cheese. These carry germs that can cause birth defects in the baby.  If you have low calcium intake from food, talk to your health care provider about whether you should take a daily calcium supplement.  Limit foods that are high in fat and processed sugars, such as fried and sweet foods.  To prevent constipation: ? Drink enough fluid to keep your urine clear or pale yellow. ? Eat foods that are high in fiber, such as fresh fruits and vegetables, whole grains, and beans. Activity  Exercise only as directed by your health care provider. Most women can continue their usual exercise routine during pregnancy. Try to exercise for 30 minutes at  least 5 days a week. Stop exercising if you experience uterine contractions.  Avoid heavy lifting, wear low heel shoes, and practice good posture.  A sexual relationship may be continued unless your health care provider directs you otherwise. Relieving pain and discomfort  Wear a good support bra to prevent discomfort from breast tenderness.  Take warm sitz baths to soothe any pain or discomfort caused by hemorrhoids. Use hemorrhoid cream if your health care provider approves.  Rest with your legs elevated if you have leg cramps or low back pain.  If you develop varicose veins, wear support hose. Elevate your feet  for 15 minutes, 3-4 times a day. Limit salt in your diet. Prenatal Care  Write down your questions. Take them to your prenatal visits.  Keep all your prenatal visits as told by your health care provider. This is important. Safety  Wear your seat belt at all times when driving.  Make a list of emergency phone numbers, including numbers for family, friends, the hospital, and police and fire departments. General instructions  Ask your health care provider for a referral to a local prenatal education class. Begin classes no later than the beginning of month 6 of your pregnancy.  Ask for help if you have counseling or nutritional needs during pregnancy. Your health care provider can offer advice or refer you to specialists for help with various needs.  Do not use hot tubs, steam rooms, or saunas.  Do not douche or use tampons or scented sanitary pads.  Do not cross your legs for long periods of time.  Avoid cat litter boxes and soil used by cats. These carry germs that can cause birth defects in the baby and possibly loss of the fetus by miscarriage or stillbirth.  Avoid all smoking, herbs, alcohol, and unprescribed drugs. Chemicals in these products can affect the formation and growth of the baby.  Do not use any products that contain nicotine or tobacco, such as cigarettes and e-cigarettes. If you need help quitting, ask your health care provider.  Visit your dentist if you have not gone yet during your pregnancy. Use a soft toothbrush to brush your teeth and be gentle when you floss. Contact a health care provider if:  You have dizziness.  You have mild pelvic cramps, pelvic pressure, or nagging pain in the abdominal area.  You have persistent nausea, vomiting, or diarrhea.  You have a bad smelling vaginal discharge.  You have pain when you urinate. Get help right away if:  You have a fever.  You are leaking fluid from your vagina.  You have spotting or bleeding from your  vagina.  You have severe abdominal cramping or pain.  You have rapid weight gain or weight loss.  You have shortness of breath with chest pain.  You notice sudden or extreme swelling of your face, hands, ankles, feet, or legs.  You have not felt your baby move in over an hour.  You have severe headaches that do not go away when you take medicine.  You have vision changes. Summary  The second trimester is from week 14 through week 27 (months 4 through 6). It is also a time when the fetus is growing rapidly.  Your body goes through many changes during pregnancy. The changes vary from woman to woman.  Avoid all smoking, herbs, alcohol, and unprescribed drugs. These chemicals affect the formation and growth your baby.  Do not use any tobacco products, such as cigarettes, chewing tobacco, and e-cigarettes. If  you need help quitting, ask your health care provider.  Contact your health care provider if you have any questions. Keep all prenatal visits as told by your health care provider. This is important. This information is not intended to replace advice given to you by your health care provider. Make sure you discuss any questions you have with your health care provider. Document Released: 06/18/2001 Document Revised: 07/30/2016 Document Reviewed: 07/30/2016 Elsevier Interactive Patient Education  2018 [employer].   Contraception Choices Contraception, also called birth control, refers to methods or devices that prevent pregnancy. Hormonal methods Contraceptive implant A contraceptive implant is a thin, plastic tube that contains a hormone. It is inserted into the upper part of the arm. It can remain in place for up to 3 years. Progestin-only injections Progestin-only injections are injections of progestin, a synthetic form of the hormone progesterone. They are given every 3 months by a health care provider. Birth control pills Birth control pills are pills that contain hormones  that prevent pregnancy. They must be taken once a day, preferably at the same time each day. Birth control patch The birth control patch contains hormones that prevent pregnancy. It is placed on the skin and must be changed once a week for three weeks and removed on the fourth week. A prescription is needed to use this method of contraception. Vaginal ring A vaginal ring contains hormones that prevent pregnancy. It is placed in the vagina for three weeks and removed on the fourth week. After that, the process is repeated with a new ring. A prescription is needed to use this method of contraception. Emergency contraceptive Emergency contraceptives prevent pregnancy after unprotected sex. They come in pill form and can be taken up to 5 days after sex. They work best the sooner they are taken after having sex. Most emergency contraceptives are available without a prescription. This method should not be used as your only form of birth control. Barrier methods Female condom A female condom is a thin sheath that is worn over the penis during sex. Condoms keep sperm from going inside a woman's body. They can be used with a spermicide to increase their effectiveness. They should be disposed after a single use. Female condom A female condom is a soft, loose-fitting sheath that is put into the vagina before sex. The condom keeps sperm from going inside a woman's body. They should be disposed after a single use. Diaphragm A diaphragm is a soft, dome-shaped barrier. It is inserted into the vagina before sex, along with a spermicide. The diaphragm blocks sperm from entering the uterus, and the spermicide kills sperm. A diaphragm should be left in the vagina for 6-8 hours after sex and removed within 24 hours. A diaphragm is prescribed and fitted by a health care provider. A diaphragm should be replaced every 1-2 years, after giving birth, after gaining more than 15 lb (6.8 kg), and after pelvic surgery. Cervical  cap A cervical cap is a round, soft latex or plastic cup that fits over the cervix. It is inserted into the vagina before sex, along with spermicide. It blocks sperm from entering the uterus. The cap should be left in place for 6-8 hours after sex and removed within 48 hours. A cervical cap must be prescribed and fitted by a health care provider. It should be replaced every 2 years. Sponge A sponge is a soft, circular piece of polyurethane foam with spermicide on it. The sponge helps block sperm from entering the uterus, and  the spermicide kills sperm. To use it, you make it wet and then insert it into the vagina. It should be inserted before sex, left in for at least 6 hours after sex, and removed and thrown away within 30 hours. Spermicides Spermicides are chemicals that kill or block sperm from entering the cervix and uterus. They can come as a cream, jelly, suppository, foam, or tablet. A spermicide should be inserted into the vagina with an applicator at least 57-32 minutes before sex to allow time for it to work. The process must be repeated every time you have sex. Spermicides do not require a prescription. Intrauterine contraception Intrauterine device (IUD) An IUD is a T-shaped device that is put in a woman's uterus. There are two types:  Hormone IUD.This type contains progestin, a synthetic form of the hormone progesterone. This type can stay in place for 3-5 years.  Copper IUD.This type is wrapped in copper wire. It can stay in place for 10 years.  Permanent methods of contraception Female tubal ligation In this method, a woman's fallopian tubes are sealed, tied, or blocked during surgery to prevent eggs from traveling to the uterus. Hysteroscopic sterilization In this method, a small, flexible insert is placed into each fallopian tube. The inserts cause scar tissue to form in the fallopian tubes and block them, so sperm cannot reach an egg. The procedure takes about 3 months to be  effective. Another form of birth control must be used during those 3 months. Female sterilization This is a procedure to tie off the tubes that carry sperm (vasectomy). After the procedure, the man can still ejaculate fluid (semen). Natural planning methods Natural family planning In this method, a couple does not have sex on days when the woman could become pregnant. Calendar method This means keeping track of the length of each menstrual cycle, identifying the days when pregnancy can happen, and not having sex on those days. Ovulation method In this method, a couple avoids sex during ovulation. Symptothermal method This method involves not having sex during ovulation. The woman typically checks for ovulation by watching changes in her temperature and in the consistency of cervical mucus. Post-ovulation method In this method, a couple waits to have sex until after ovulation. Summary  Contraception, also called birth control, means methods or devices that prevent pregnancy.  Hormonal methods of contraception include implants, injections, pills, patches, vaginal rings, and emergency contraceptives.  Barrier methods of contraception can include female condoms, female condoms, diaphragms, cervical caps, sponges, and spermicides.  There are two types of IUDs (intrauterine devices). An IUD can be put in a woman's uterus to prevent pregnancy for 3-5 years.  Permanent sterilization can be done through a procedure for males, females, or both.  Natural family planning methods involve not having sex on days when the woman could become pregnant. This information is not intended to replace advice given to you by your health care provider. Make sure you discuss any questions you have with your health care provider. Document Released: 06/24/2005 Document Revised: 07/27/2016 Document Reviewed: 07/27/2016 Elsevier Interactive Patient Education  2018 [employer].   Breastfeeding Choosing to breastfeed  is one of the best decisions you can make for yourself and your baby. A change in hormones during pregnancy causes your breasts to make breast milk in your milk-producing glands. Hormones prevent breast milk from being released before your baby is born. They also prompt milk flow after birth. Once breastfeeding has begun, thoughts of your baby, as well as his  or her sucking or crying, can stimulate the release of milk from your milk-producing glands. Benefits of breastfeeding Research shows that breastfeeding offers many health benefits for infants and mothers. It also offers a cost-free and convenient way to feed your baby. For your baby  Your first milk (colostrum) helps your baby's digestive system to function better.  Special cells in your milk (antibodies) help your baby to fight off infections.  Breastfed babies are less likely to develop asthma, allergies, obesity, or type 2 diabetes. They are also at lower risk for sudden infant death syndrome (SIDS).  Nutrients in breast milk are better able to meet your baby's needs compared to infant formula.  Breast milk improves your baby's brain development. For you  Breastfeeding helps to create a very special bond between you and your baby.  Breastfeeding is convenient. Breast milk costs nothing and is always available at the correct temperature.  Breastfeeding helps to burn calories. It helps you to lose the weight that you gained during pregnancy.  Breastfeeding makes your uterus return faster to its size before pregnancy. It also slows bleeding (lochia) after you give birth.  Breastfeeding helps to lower your risk of developing type 2 diabetes, osteoporosis, rheumatoid arthritis, cardiovascular disease, and breast, ovarian, uterine, and endometrial cancer later in life. Breastfeeding basics Starting breastfeeding  Find a comfortable place to sit or lie down, with your neck and back well-supported.  Place a pillow or a rolled-up  blanket under your baby to bring him or her to the level of your breast (if you are seated). Nursing pillows are specially designed to help support your arms and your baby while you breastfeed.  Make sure that your baby's tummy (abdomen) is facing your abdomen.  Gently massage your breast. With your fingertips, massage from the outer edges of your breast inward toward the nipple. This encourages milk flow. If your milk flows slowly, you may need to continue this action during the feeding.  Support your breast with 4 fingers underneath and your thumb above your nipple (make the letter "C" with your hand). Make sure your fingers are well away from your nipple and your baby's mouth.  Stroke your baby's lips gently with your finger or nipple.  When your baby's mouth is open wide enough, quickly bring your baby to your breast, placing your entire nipple and as much of the areola as possible into your baby's mouth. The areola is the colored area around your nipple. ? More areola should be visible above your baby's upper lip than below the lower lip. ? Your baby's lips should be opened and extended outward (flanged) to ensure an adequate, comfortable latch. ? Your baby's tongue should be between his or her lower gum and your breast.  Make sure that your baby's mouth is correctly positioned around your nipple (latched). Your baby's lips should create a seal on your breast and be turned out (everted).  It is common for your baby to suck about 2-3 minutes in order to start the flow of breast milk. Latching Teaching your baby how to latch onto your breast properly is very important. An improper latch can cause nipple pain, decreased milk supply, and poor weight gain in your baby. Also, if your baby is not latched onto your nipple properly, he or she may swallow some air during feeding. This can make your baby fussy. Burping your baby when you switch breasts during the feeding can help to get rid of the air.  However, teaching your  baby to latch on properly is still the best way to prevent fussiness from swallowing air while breastfeeding. Signs that your baby has successfully latched onto your nipple  Silent tugging or silent sucking, without causing you pain. Infant's lips should be extended outward (flanged).  Swallowing heard between every 3-4 sucks once your milk has started to flow (after your let-down milk reflex occurs).  Muscle movement above and in front of his or her ears while sucking.  Signs that your baby has not successfully latched onto your nipple  Sucking sounds or smacking sounds from your baby while breastfeeding.  Nipple pain.  If you think your baby has not latched on correctly, slip your finger into the corner of your baby's mouth to break the suction and place it between your baby's gums. Attempt to start breastfeeding again. Signs of successful breastfeeding Signs from your baby  Your baby will gradually decrease the number of sucks or will completely stop sucking.  Your baby will fall asleep.  Your baby's body will relax.  Your baby will retain a small amount of milk in his or her mouth.  Your baby will let go of your breast by himself or herself.  Signs from you  Breasts that have increased in firmness, weight, and size 1-3 hours after feeding.  Breasts that are softer immediately after breastfeeding.  Increased milk volume, as well as a change in milk consistency and color by the fifth day of breastfeeding.  Nipples that are not sore, cracked, or bleeding.  Signs that your baby is getting enough milk  Wetting at least 1-2 diapers during the first 24 hours after birth.  Wetting at least 5-6 diapers every 24 hours for the first week after birth. The urine should be clear or pale yellow by the age of 5 days.  Wetting 6-8 diapers every 24 hours as your baby continues to grow and develop.  At least 3 stools in a 24-hour period by the age of 5 days. The  stool should be soft and yellow.  At least 3 stools in a 24-hour period by the age of 7 days. The stool should be seedy and yellow.  No loss of weight greater than 10% of birth weight during the first 3 days of life.  Average weight gain of 4-7 oz (113-198 g) per week after the age of 4 days.  Consistent daily weight gain by the age of 5 days, without weight loss after the age of 2 weeks. After a feeding, your baby may spit up a small amount of milk. This is normal. Breastfeeding frequency and duration Frequent feeding will help you make more milk and can prevent sore nipples and extremely full breasts (breast engorgement). Breastfeed when you feel the need to reduce the fullness of your breasts or when your baby shows signs of hunger. This is called "breastfeeding on demand." Signs that your baby is hungry include:  Increased alertness, activity, or restlessness.  Movement of the head from side to side.  Opening of the mouth when the corner of the mouth or cheek is stroked (rooting).  Increased sucking sounds, smacking lips, cooing, sighing, or squeaking.  Hand-to-mouth movements and sucking on fingers or hands.  Fussing or crying.  Avoid introducing a pacifier to your baby in the first 4-6 weeks after your baby is born. After this time, you may choose to use a pacifier. Research has shown that pacifier use during the first year of a baby's life decreases the risk of sudden  infant death syndrome (SIDS). Allow your baby to feed on each breast as long as he or she wants. When your baby unlatches or falls asleep while feeding from the first breast, offer the second breast. Because newborns are often sleepy in the first few weeks of life, you may need to awaken your baby to get him or her to feed. Breastfeeding times will vary from baby to baby. However, the following rules can serve as a guide to help you make sure that your baby is properly fed:  Newborns (babies 41 weeks of age or  younger) may breastfeed every 1-3 hours.  Newborns should not go without breastfeeding for longer than 3 hours during the day or 5 hours during the night.  You should breastfeed your baby a minimum of 8 times in a 24-hour period.  Breast milk pumping Pumping and storing breast milk allows you to make sure that your baby is exclusively fed your breast milk, even at times when you are unable to breastfeed. This is especially important if you go back to work while you are still breastfeeding, or if you are not able to be present during feedings. Your lactation consultant can help you find a method of pumping that works best for you and give you guidelines about how long it is safe to store breast milk. Caring for your breasts while you breastfeed Nipples can become dry, cracked, and sore while breastfeeding. The following recommendations can help keep your breasts moisturized and healthy:  Avoid using soap on your nipples.  Wear a supportive bra designed especially for nursing. Avoid wearing underwire-style bras or extremely tight bras (sports bras).  Air-dry your nipples for 3-4 minutes after each feeding.  Use only cotton bra pads to absorb leaked breast milk. Leaking of breast milk between feedings is normal.  Use lanolin on your nipples after breastfeeding. Lanolin helps to maintain your skin's normal moisture barrier. Pure lanolin is not harmful (not toxic) to your baby. You may also hand express a few drops of breast milk and gently massage that milk into your nipples and allow the milk to air-dry.  In the first few weeks after giving birth, some women experience breast engorgement. Engorgement can make your breasts feel heavy, warm, and tender to the touch. Engorgement peaks within 3-5 days after you give birth. The following recommendations can help to ease engorgement:  Completely empty your breasts while breastfeeding or pumping. You may want to start by applying warm, moist heat (in  the shower or with warm, water-soaked hand towels) just before feeding or pumping. This increases circulation and helps the milk flow. If your baby does not completely empty your breasts while breastfeeding, pump any extra milk after he or she is finished.  Apply ice packs to your breasts immediately after breastfeeding or pumping, unless this is too uncomfortable for you. To do this: ? Put ice in a plastic bag. ? Place a towel between your skin and the bag. ? Leave the ice on for 20 minutes, 2-3 times a day.  Make sure that your baby is latched on and positioned properly while breastfeeding.  If engorgement persists after 48 hours of following these recommendations, contact your health care provider or a Science writer. Overall health care recommendations while breastfeeding  Eat 3 healthy meals and 3 snacks every day. Well-nourished mothers who are breastfeeding need an additional 450-500 calories a day. You can meet this requirement by increasing the amount of a balanced diet that you eat.  Drink enough water to keep your urine pale yellow or clear.  Rest often, relax, and continue to take your prenatal vitamins to prevent fatigue, stress, and low vitamin and mineral levels in your body (nutrient deficiencies).  Do not use any products that contain nicotine or tobacco, such as cigarettes and e-cigarettes. Your baby may be harmed by chemicals from cigarettes that pass into breast milk and exposure to secondhand smoke. If you need help quitting, ask your health care provider.  Avoid alcohol.  Do not use illegal drugs or marijuana.  Talk with your health care provider before taking any medicines. These include over-the-counter and prescription medicines as well as vitamins and herbal supplements. Some medicines that may be harmful to your baby can pass through breast milk.  It is possible to become pregnant while breastfeeding. If birth control is desired, ask your health care  provider about options that will be safe while breastfeeding your baby. Where to find more information: [employer]: www.llli.org Contact a health care provider if:  You feel like you want to stop breastfeeding or have become frustrated with breastfeeding.  Your nipples are cracked or bleeding.  Your breasts are red, tender, or warm.  You have: ? Painful breasts or nipples. ? A swollen area on either breast. ? A fever or chills. ? Nausea or vomiting. ? Drainage other than breast milk from your nipples.  Your breasts do not become full before feedings by the fifth day after you give birth.  You feel sad and depressed.  Your baby is: ? Too sleepy to eat well. ? Having trouble sleeping. ? More than 66 week old and wetting fewer than 6 diapers in a 24-hour period. ? Not gaining weight by 35 days of age.  Your baby has fewer than 3 stools in a 24-hour period.  Your baby's skin or the white parts of his or her eyes become yellow. Get help right away if:  Your baby is overly tired (lethargic) and does not want to wake up and feed.  Your baby develops an unexplained fever. Summary  Breastfeeding offers many health benefits for infant and mothers.  Try to breastfeed your infant when he or she shows early signs of hunger.  Gently tickle or stroke your baby's lips with your finger or nipple to allow the baby to open his or her mouth. Bring the baby to your breast. Make sure that much of the areola is in your baby's mouth. Offer one side and burp the baby before you offer the other side.  Talk with your health care provider or lactation consultant if you have questions or you face problems as you breastfeed. This information is not intended to replace advice given to you by your health care provider. Make sure you discuss any questions you have with your health care provider. Document Released: 06/24/2005 Document Revised: 07/26/2016 Document Reviewed:  07/26/2016 Elsevier Interactive Patient Education  [employer].

## 2018-02-02 NOTE — Addendum Note (Signed)
Addended by: Dalphine HandingGARDNER, Stony Stegmann L on: 02/02/2018 11:53 AM   Modules accepted: Orders

## 2018-02-03 LAB — CERVICOVAGINAL ANCILLARY ONLY
BACTERIAL VAGINITIS: NEGATIVE
Candida vaginitis: NEGATIVE

## 2018-02-04 LAB — URINE CULTURE

## 2018-02-09 ENCOUNTER — Encounter: Payer: Self-pay | Admitting: *Deleted

## 2018-02-10 LAB — OBSTETRIC PANEL, INCLUDING HIV
ANTIBODY SCREEN: NEGATIVE
BASOS: 0 %
Basophils Absolute: 0 10*3/uL (ref 0.0–0.2)
EOS (ABSOLUTE): 0.2 10*3/uL (ref 0.0–0.4)
EOS: 3 %
HEMATOCRIT: 32.2 % — AB (ref 34.0–46.6)
HEMOGLOBIN: 11.3 g/dL (ref 11.1–15.9)
HEP B S AG: NEGATIVE
HIV Screen 4th Generation wRfx: NONREACTIVE
IMMATURE GRANS (ABS): 0 10*3/uL (ref 0.0–0.1)
IMMATURE GRANULOCYTES: 1 %
LYMPHS: 24 %
Lymphocytes Absolute: 1.4 10*3/uL (ref 0.7–3.1)
MCH: 30.5 pg (ref 26.6–33.0)
MCHC: 35.1 g/dL (ref 31.5–35.7)
MCV: 87 fL (ref 79–97)
MONOCYTES: 6 %
MONOS ABS: 0.4 10*3/uL (ref 0.1–0.9)
NEUTROS PCT: 66 %
Neutrophils Absolute: 3.9 10*3/uL (ref 1.4–7.0)
Platelets: 178 10*3/uL (ref 150–450)
RBC: 3.71 x10E6/uL — AB (ref 3.77–5.28)
RDW: 13.4 % (ref 12.3–15.4)
RH TYPE: POSITIVE
RPR: NONREACTIVE
RUBELLA: 3.63 {index} (ref >0.99)
WBC: 5.9 10*3/uL (ref 3.4–10.8)

## 2018-02-10 LAB — SMN1 COPY NUMBER ANALYSIS (SMA CARRIER SCREENING)

## 2018-02-10 LAB — HEMOGLOBINOPATHY EVALUATION
FERRITIN: 42 ng/mL (ref 15–150)
HGB F QUANT: 0 % (ref 0.0–2.0)
HGB S: 0 %
HGB SOLUBILITY: NEGATIVE
Hgb A2 Quant: 2.3 % (ref 1.8–3.2)
Hgb A: 97.7 % (ref 96.4–98.8)
Hgb C: 0 %
Hgb Variant: 0 %

## 2018-02-10 LAB — CYSTIC FIBROSIS MUTATION 97: Interpretation: NOT DETECTED

## 2018-03-02 ENCOUNTER — Ambulatory Visit (INDEPENDENT_AMBULATORY_CARE_PROVIDER_SITE_OTHER): Payer: BLUE CROSS/BLUE SHIELD | Admitting: Obstetrics and Gynecology

## 2018-03-02 ENCOUNTER — Encounter: Payer: Self-pay | Admitting: Obstetrics and Gynecology

## 2018-03-02 VITALS — BP 111/69 | HR 81 | Wt 143.8 lb

## 2018-03-02 DIAGNOSIS — Z3480 Encounter for supervision of other normal pregnancy, unspecified trimester: Secondary | ICD-10-CM

## 2018-03-02 NOTE — Progress Notes (Signed)
   PRENATAL VISIT NOTE  Subjective:  Cindy BirchwoodRaushanda Levine is a 22 y.o. G1P0 at 4912w6d being seen today for ongoing prenatal care.  She is currently monitored for the following issues for this low-risk pregnancy and has Encounter for supervision of normal pregnancy, antepartum on their problem list.  Patient reports occasional cramping.  Contractions: Not present. Vag. Bleeding: None.  Movement: Present. Denies leaking of fluid.   The following portions of the patient's history were reviewed and updated as appropriate: allergies, current medications, past family history, past medical history, past social history, past surgical history and problem list. Problem list updated.  Objective:   Vitals:   03/02/18 1033  BP: 111/69  Pulse: 81  Weight: 143 lb 12.8 oz (65.2 kg)    Fetal Status: Fetal Heart Rate (bpm): 138   Movement: Present     General:  Alert, oriented and cooperative. Patient is in no acute distress.  Skin: Skin is warm and dry. No rash noted.   Cardiovascular: Normal heart rate noted  Respiratory: Normal respiratory effort, no problems with respiration noted  Abdomen: Soft, gravid, appropriate for gestational age.  Pain/Pressure: Absent     Pelvic: Cervical exam deferred        Extremities: Normal range of motion.  Edema: Trace  Mental Status: Normal mood and affect. Normal behavior. Normal judgment and thought content.   Assessment and Plan:  Pregnancy: G1P0 at 4912w6d  1. Supervision of other normal pregnancy, antepartum Does not want to know gender NIPS low risk Interested in waterbirth, gave info  Preterm labor symptoms and general obstetric precautions including but not limited to vaginal bleeding, contractions, leaking of fluid and fetal movement were reviewed in detail with the patient. Please refer to After Visit Summary for other counseling recommendations.  Return in about 4 weeks (around 03/30/2018) for OB visit.  Future Appointments  Date Time Provider  Department Center  03/12/2018 10:15 AM [facility] US 4 [facility]  03/30/2018 10:15 AM Adam PhenixArnold, James G, MD [facility] None    Conan BowensKelly M Ryoma Nofziger, MD

## 2018-03-02 NOTE — Progress Notes (Signed)
Pt is G1P0 2331w6d here for ROB visit.

## 2018-03-02 NOTE — Patient Instructions (Addendum)
For colds and allergies  Any anti-histamine including benadryl, allegra, claritin, etc.  Sudafed but not phenylephrine  Mucinex  Robitussin  For Reflux/heartburn  Pepcid Zantac Tums Prilosec Prevacid  For yeast infections  Monistat  For constipation  Colace  For minor aches and pains  Tylenol-do not take more than '4000mg'$  in 24 hours. Therma-care or like heat packs    Contraception Choices Contraception, also called birth control, refers to methods or devices that prevent pregnancy. Hormonal methods Contraceptive implant A contraceptive implant is a thin, plastic tube that contains a hormone. It is inserted into the upper part of the arm. It can remain in place for up to 3 years. Progestin-only injections Progestin-only injections are injections of progestin, a synthetic form of the hormone progesterone. They are given every 3 months by a health care provider. Birth control pills Birth control pills are pills that contain hormones that prevent pregnancy. They must be taken once a day, preferably at the same time each day. Birth control patch The birth control patch contains hormones that prevent pregnancy. It is placed on the skin and must be changed once a week for three weeks and removed on the fourth week. A prescription is needed to use this method of contraception. Vaginal ring A vaginal ring contains hormones that prevent pregnancy. It is placed in the vagina for three weeks and removed on the fourth week. After that, the process is repeated with a new ring. A prescription is needed to use this method of contraception. Emergency contraceptive Emergency contraceptives prevent pregnancy after unprotected sex. They come in pill form and can be taken up to 5 days after sex. They work best the sooner they are taken after having sex. Most emergency contraceptives are available without a prescription. This method should not be used as your only form of birth  control. Barrier methods Female condom A female condom is a thin sheath that is worn over the penis during sex. Condoms keep sperm from going inside a woman's body. They can be used with a spermicide to increase their effectiveness. They should be disposed after a single use. Female condom A female condom is a soft, loose-fitting sheath that is put into the vagina before sex. The condom keeps sperm from going inside a woman's body. They should be disposed after a single use.  Intrauterine contraception Intrauterine device (IUD) An IUD is a T-shaped device that is put in a woman's uterus. There are two types:  Hormone IUD.This type contains progestin, a synthetic form of the hormone progesterone. This type can stay in place for 3-5 years.  Copper IUD.This type is wrapped in copper wire. It can stay in place for 10 years.  Permanent methods of contraception Female tubal ligation In this method, a woman's fallopian tubes are sealed, tied, or blocked during surgery to prevent eggs from traveling to the uterus.  Female sterilization This is a procedure to tie off the tubes that carry sperm (vasectomy). After the procedure, the man can still ejaculate fluid (semen).  Summary  Contraception, also called birth control, means methods or devices that prevent pregnancy.  Hormonal methods of contraception include implants, injections, pills, patches, vaginal rings, and emergency contraceptives.  Barrier methods of contraception can include female condoms, female condoms, diaphragms, cervical caps, sponges, and spermicides.  There are two types of IUDs (intrauterine devices). An IUD can be put in a woman's uterus to prevent pregnancy for 3-5 years.  Permanent sterilization can be done through a procedure for males, females,  or both. This information is not intended to replace advice given to you by your health care provider. Make sure you discuss any questions you have with your health care  provider. Document Released: 06/24/2005 Document Revised: 07/27/2016 Document Reviewed: 07/27/2016 Elsevier Interactive Patient Education  2018 [place]? Guide for patients at [facility] [employer]  Why consider waterbirth?  . Gentle birth for babies . Less pain medicine used in labor . May allow for passive descent/less pushing . May reduce perineal tears  . More mobility and instinctive maternal position changes . Increased maternal relaxation . Reduced blood pressure in labor  Is waterbirth safe? What are the risks of infection, drowning or other complications?  . Infection: o Very low risk (3.7 % for tub vs 4.8% for bed) o 7 in 8000 waterbirths with documented infection o Poorly cleaned equipment most common cause o Slightly lower group B strep transmission rate  . Drowning o Maternal:  - Very low risk   - Related to seizures or fainting o Newborn:  - Very low risk. No evidence of increased risk of respiratory problems in multiple large studies - Physiological protection from breathing under water - Avoid underwater birth if there are any fetal complications - Once baby's head is out of the water, keep it out.  . Birth complication o Some reports of cord trauma, but risk decreased by bringing baby to surface gradually o No evidence of increased risk of shoulder dystocia. Mothers can usually change positions faster in water than in a bed, possibly aiding the maneuvers to free the shoulder.   You must attend a Doren Custard class at [facility]  3rd Wednesday of every month from 7-9pm  [employer] by calling [phone] or online at [email or web address]  Bring [place] the certificate from the class to your prenatal appointment  Meet with a midwife at 36 weeks to see if you can still plan a waterbirth and to sign the consent.   Purchase or rent the following supplies: You are responsible for providing all supplies listed  above. **If you do not have all necessary supplies you cannot have a waterbirth.**   Water Birth Pool (Birth Pool in a Box or [place] for instance)  (Tubs start ~$125)  Single-use disposable tub liner designed for your brand of tub  Electric drain pump to remove water (We recommend 792 gallon per hour or greater pump.)   New garden hose labeled "lead-free", "suitable for drinking water",  Separate garden hose to remove the dirty water  Fish net  Bathing suit top (optional)  Long-handled mirror (optional)  Places to purchase or rent supplies:   [employer] for tub purchases and supplies  [employer]com for tub purchases and supplies  [employer] (www.thelaborladies.com) $275 for tub rental/set-up & take down/kit   [employer] (http://www.fleming.com/.htm) Information regarding doulas (labor support) who provide pool rentals  Things that would prevent you from having a waterbirth:  Premature, <37wks  Previous cesarean birth  Presence of thick meconium-stained fluid  Multiple gestation (Twins, triplets, etc.)  Uncontrolled diabetes or gestational diabetes requiring medication  Hypertension requiring medication or diagnosis of pre-eclampsia  Heavy vaginal bleeding  Non-reassuring fetal heart rate  Active infection (MRSA, etc.). Group B Strep is NOT a contraindication for waterbirth.  If your labor has to be induced and induction method requires continuous monitoring of the baby's heart rate  Other risks/issues identified by your obstetrical provider  Please remember that birth is unpredictable. Under  certain unforeseeable circumstances your provider may advise against giving birth in the tub. These decisions will be made on a case-by-case basis and with the safety of you and your baby as our highest priority.

## 2018-03-05 ENCOUNTER — Encounter (HOSPITAL_COMMUNITY): Payer: Self-pay

## 2018-03-12 ENCOUNTER — Other Ambulatory Visit: Payer: Self-pay | Admitting: Obstetrics and Gynecology

## 2018-03-12 ENCOUNTER — Ambulatory Visit (HOSPITAL_COMMUNITY)
Admission: RE | Admit: 2018-03-12 | Discharge: 2018-03-12 | Disposition: A | Discharge status: Home / Self Care | Payer: BLUE CROSS/BLUE SHIELD | Source: Ambulatory Visit | Attending: Obstetrics and Gynecology | Admitting: Obstetrics and Gynecology

## 2018-03-12 ENCOUNTER — Encounter (HOSPITAL_COMMUNITY): Payer: Self-pay

## 2018-03-12 DIAGNOSIS — Z3482 Encounter for supervision of other normal pregnancy, second trimester: Secondary | ICD-10-CM | POA: Insufficient documentation

## 2018-03-12 DIAGNOSIS — O359XX0 Maternal care for (suspected) fetal abnormality and damage, unspecified, not applicable or unspecified: Secondary | ICD-10-CM | POA: Diagnosis not present

## 2018-03-12 DIAGNOSIS — Z3A19 19 weeks gestation of pregnancy: Secondary | ICD-10-CM | POA: Diagnosis not present

## 2018-03-12 DIAGNOSIS — Z363 Encounter for antenatal screening for malformations: Secondary | ICD-10-CM

## 2018-03-12 DIAGNOSIS — Z3480 Encounter for supervision of other normal pregnancy, unspecified trimester: Secondary | ICD-10-CM

## 2018-03-13 ENCOUNTER — Other Ambulatory Visit (HOSPITAL_COMMUNITY): Payer: Self-pay | Admitting: Obstetrics and Gynecology

## 2018-03-13 ENCOUNTER — Ambulatory Visit (HOSPITAL_COMMUNITY)
Admission: RE | Admit: 2018-03-13 | Discharge: 2018-03-13 | Disposition: A | Discharge status: Home / Self Care | Payer: BLUE CROSS/BLUE SHIELD | Source: Ambulatory Visit | Attending: Obstetrics and Gynecology | Admitting: Obstetrics and Gynecology

## 2018-03-13 DIAGNOSIS — O359XX0 Maternal care for (suspected) fetal abnormality and damage, unspecified, not applicable or unspecified: Secondary | ICD-10-CM

## 2018-03-13 DIAGNOSIS — Z3A19 19 weeks gestation of pregnancy: Secondary | ICD-10-CM | POA: Diagnosis not present

## 2018-03-13 DIAGNOSIS — Z3A18 18 weeks gestation of pregnancy: Secondary | ICD-10-CM | POA: Diagnosis not present

## 2018-03-13 DIAGNOSIS — O358XX0 Maternal care for other (suspected) fetal abnormality and damage, not applicable or unspecified: Secondary | ICD-10-CM | POA: Diagnosis present

## 2018-03-13 DIAGNOSIS — O35BXX0 Maternal care for other (suspected) fetal abnormality and damage, fetal cardiac anomalies, not applicable or unspecified: Secondary | ICD-10-CM

## 2018-03-13 IMAGING — US US MFM AMNIOCENTESIS
1 series · 9 of 9 positions shown · non-contrast
Comparison: none

[Series 1: us mfm amniocentesis · 9 of 9 slices shown]
[im 1/9]
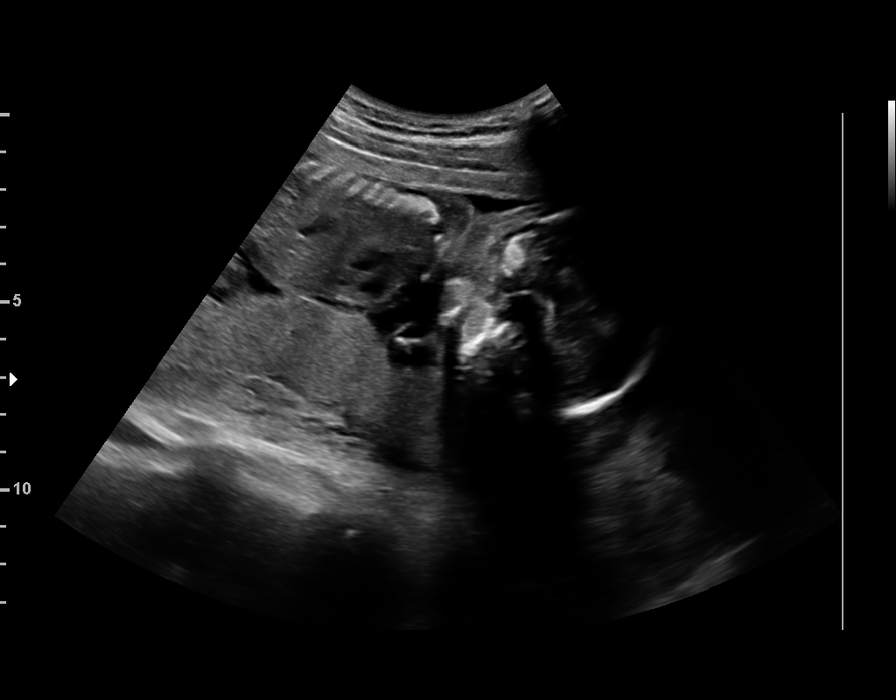
[im 2/9]
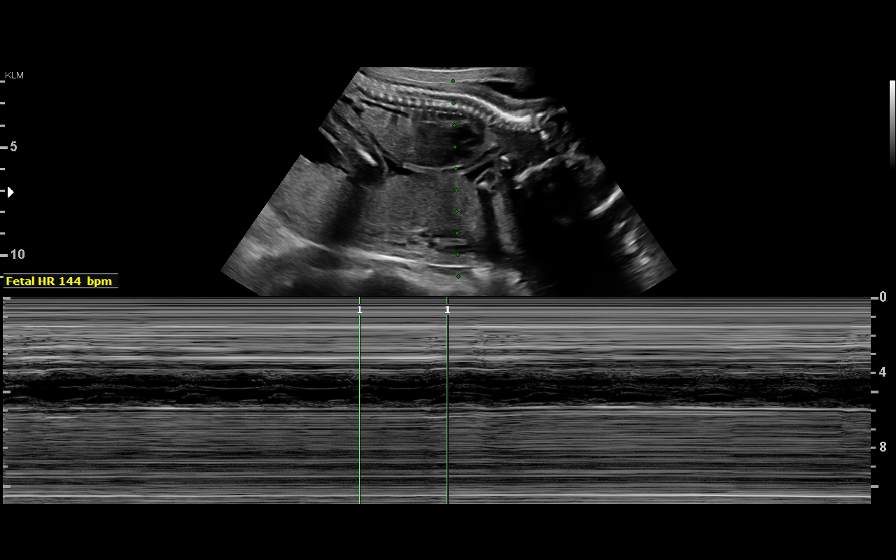
[im 3/9]
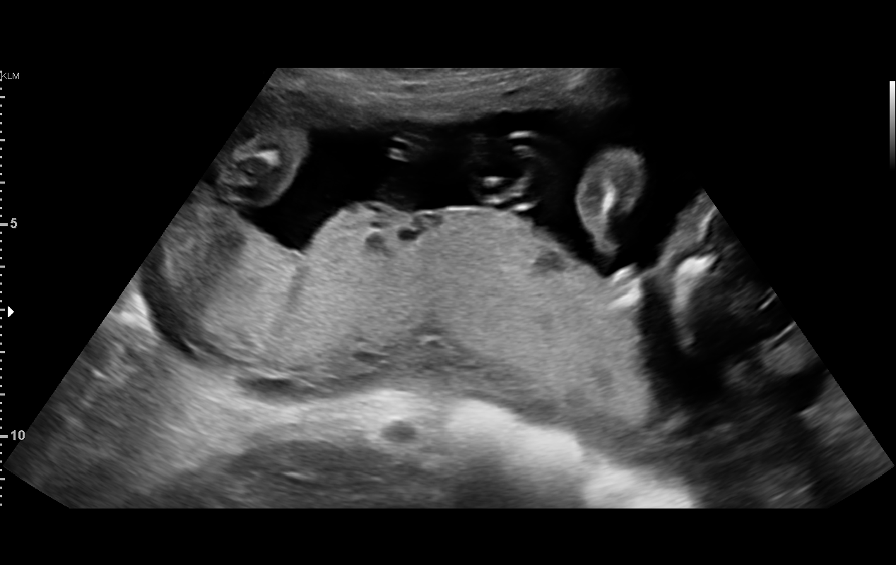
[im 4/9]
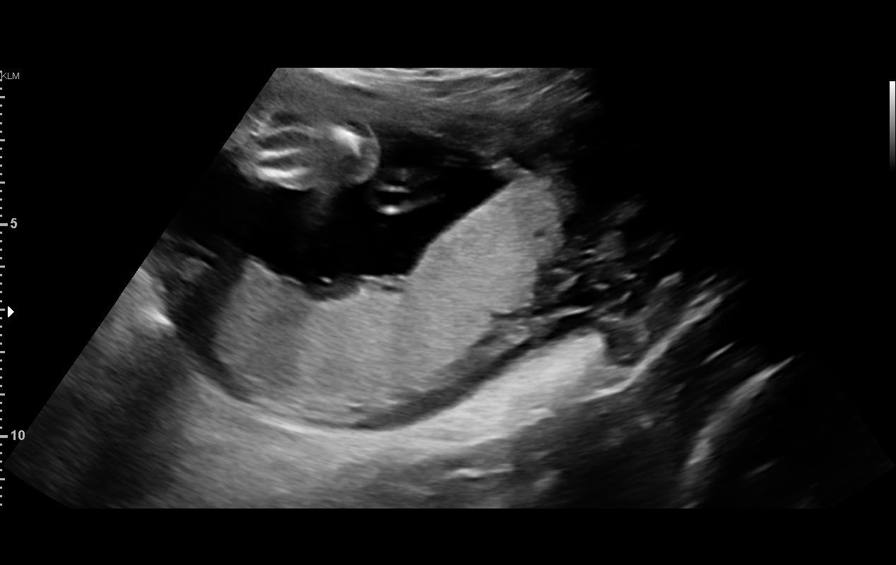
[im 5/9]
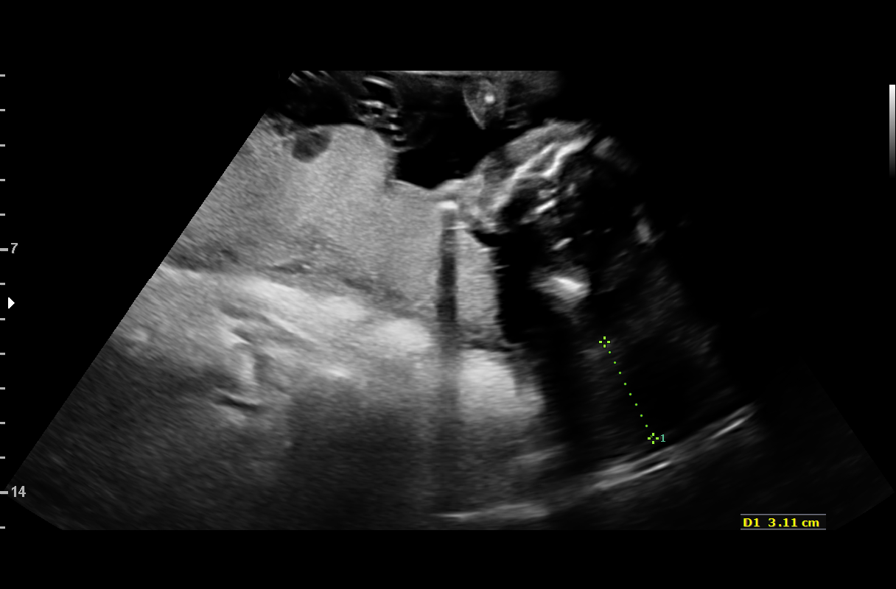
[im 6/9]
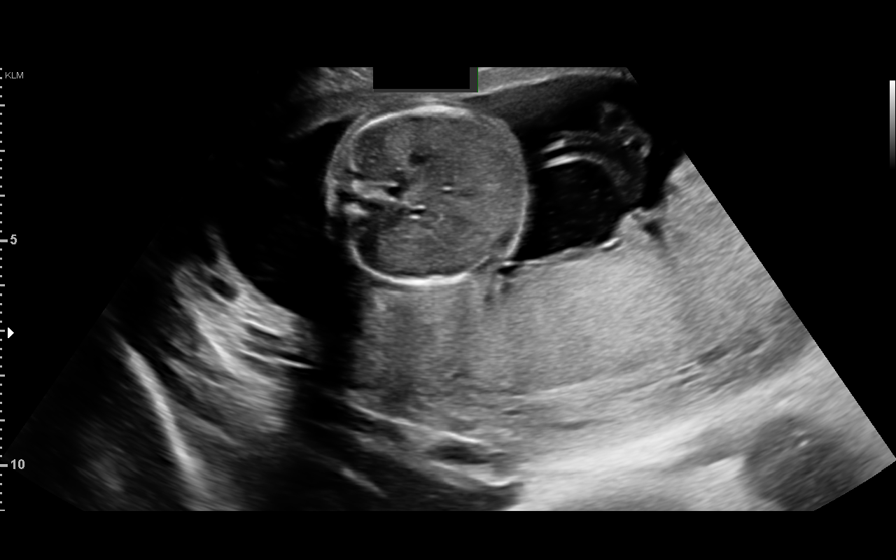
[im 7/9]
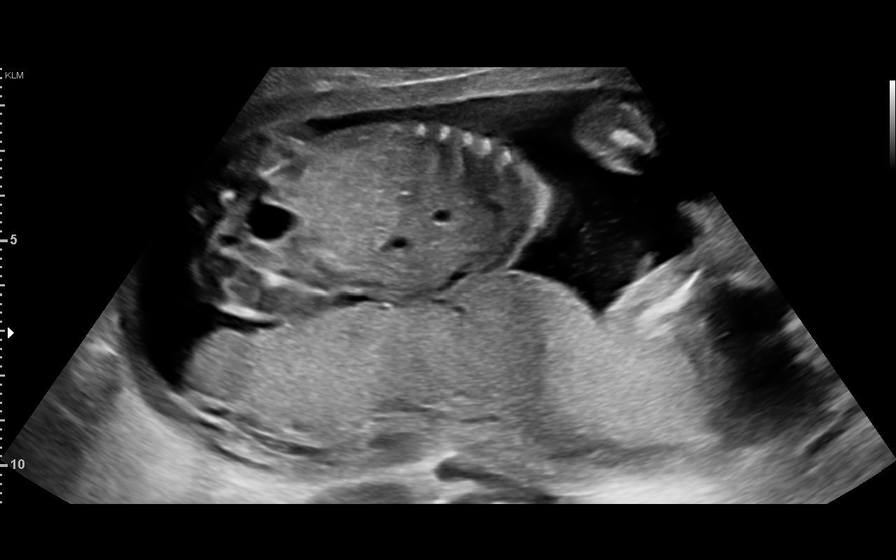
[im 8/9]
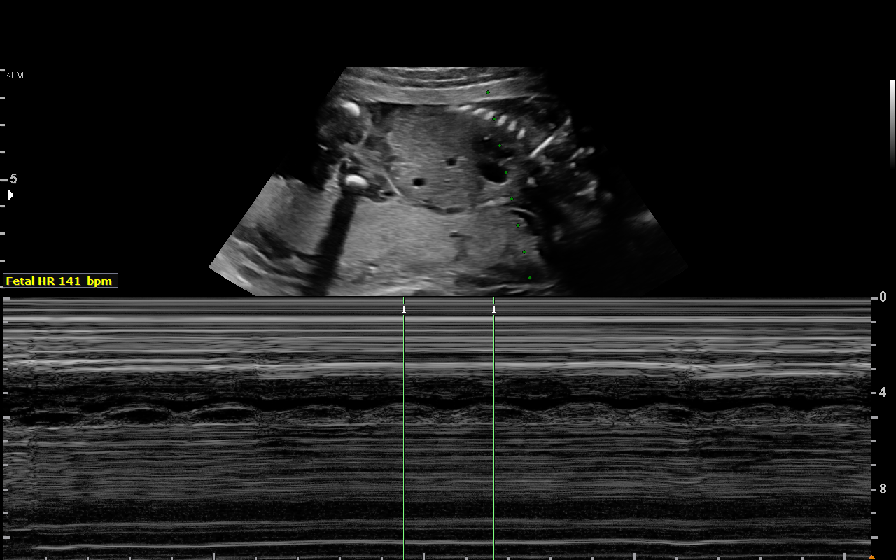
[im 9/9]
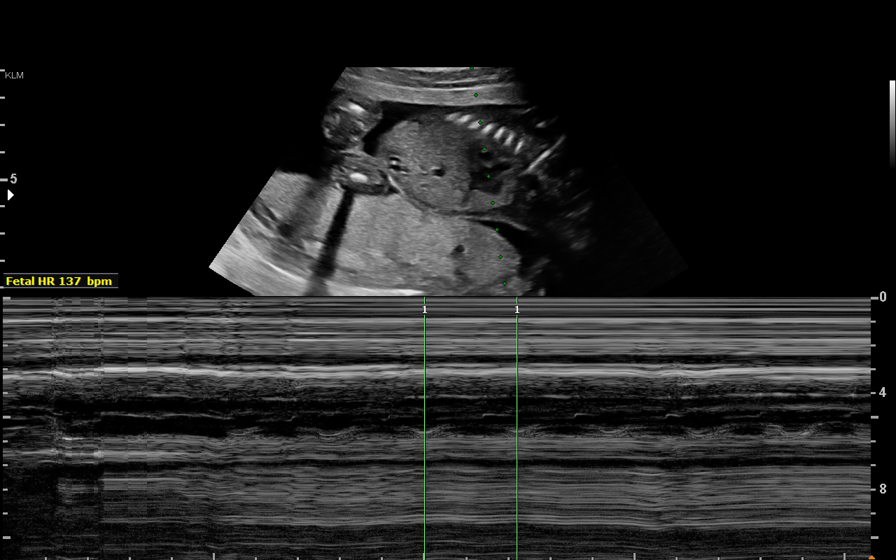

[9 of 9 positions shown; findings below may reference images not displayed]

Indications

Fetal abnormality - other known or             [SX]
suspected (EIFLV)
19 weeks gestation of pregnancy
Vital Signs

Height:        5'7"
Fetal Evaluation

Num Of Fetuses:         1
Fetal Heart Rate(bpm):  144
Cardiac Activity:       Observed
Presentation:           Cephalic
Placenta:               Posterior
P. Cord Insertion:      Previously Visualized

Amniotic Fluid
AFI FV:      Within normal limits
OB History

Gravidity:    1
Gestational Age

LMP:           19w 3d        Date:  [DATE]                 EDD:   [DATE]
Best:          19w 3d     Det. By:  LMP  ([DATE])          EDD:   [DATE]
Guided Procedures

Type:   Amniocentesis
FH Post Procedure:     Normal             RH Type:          O positive
Rh Immune Globulin:    Not required,      Discharge Inst.:  Post-procedure
Rh positive                          instructions
given
Needle Insertions:     22 gauge x 1       Vol. Withdrawn:   24 ml of clear
amniotic fluid
Catheter Passes:       1 pass
Transabdominal:        Yes

Complications:  None

Comment:                    Informed consent was obtained. A "time-out" was performed before the procedure. Patient
tolerated the procedure well. We gave her post-procedure instructions.
Cervix Uterus Adnexa

Cervix
Length:            3.1  cm.
Normal appearance by transabdominal scan.
Impression

Patient called yesterday and requested amniocentesis. She
had opted not have genetic counseling. On ultrasound,
echogenic intracardiac focus was seen. On cell-free fetal
DNA screening, the risk of Down syndrome was not
increased.
A limited ultrasound study was performed. Amniotic fluid is
normal and good fetal activity is seen.
I explained amniocentesis procedure and its possible
complications including miscarriage (1 in 400 procedures).
Patient may also choose not to have amniocentesis because
of very low-likelihood of having a baby with Down syndrome.
Patient informed that she would continue her pregnancy
regardless of karyotype results. She would like to be
prepared and have the knowledge of fetal karyotype before
birth.
I discussed FISH, karyotype and microarray analysis that
detects some and not all genetic conditions. Patient
requested FISH and microarray analysis.
After informed consent, amniocentesis was performed under
ultrasound guidance by Dr. FERIENHAUS and 24 milliliters of clear
amniotic fluid was withdrawn. The sample was sent to
[REDACTED] for AFAFP, FISH, karyotype and microarray. Patient
tolerated the procedure well. We gave her post-procedure
instructions.
Recommendations

-We will communicate the results to the patient and fax a
copy to your office.
-Follow-up scans as clinically indicated.

## 2018-03-16 ENCOUNTER — Telehealth (HOSPITAL_COMMUNITY): Payer: Self-pay | Admitting: *Deleted

## 2018-03-18 ENCOUNTER — Encounter: Payer: Self-pay | Admitting: Obstetrics & Gynecology

## 2018-03-26 ENCOUNTER — Telehealth (HOSPITAL_COMMUNITY): Payer: Self-pay | Admitting: *Deleted

## 2018-03-26 NOTE — Telephone Encounter (Signed)
Name and DOB verified.  Normal female karyotype result from amnio given.  Pt voiced understanding.

## 2018-03-30 ENCOUNTER — Ambulatory Visit (INDEPENDENT_AMBULATORY_CARE_PROVIDER_SITE_OTHER): Payer: BLUE CROSS/BLUE SHIELD | Admitting: Obstetrics & Gynecology

## 2018-03-30 VITALS — BP 105/66 | HR 94 | Wt 152.0 lb

## 2018-03-30 DIAGNOSIS — Z3480 Encounter for supervision of other normal pregnancy, unspecified trimester: Secondary | ICD-10-CM

## 2018-03-30 NOTE — Patient Instructions (Addendum)
Second Trimester of Pregnancy The second trimester is from week 13 through week 28, month 4 through 6. This is often the time in pregnancy that you feel your best. Often times, morning sickness has lessened or quit. You may have more energy, and you may get hungry more often. Your unborn baby (fetus) is growing rapidly. At the end of the sixth month, he or she is about 9 inches long and weighs about 1 pounds. You will likely feel the baby move (quickening) between 18 and 20 weeks of pregnancy. Follow these instructions at home:  Avoid all smoking, herbs, and alcohol. Avoid drugs not approved by your doctor.  Do not use any tobacco products, including cigarettes, chewing tobacco, and electronic cigarettes. If you need help quitting, ask your doctor. You may get counseling or other support to help you quit.  Only take medicine as told by your doctor. Some medicines are safe and some are not during pregnancy.  Exercise only as told by your doctor. Stop exercising if you start having cramps.  Eat regular, healthy meals.  Wear a good support bra if your breasts are tender.  Do not use hot tubs, steam rooms, or saunas.  Wear your seat belt when driving.  Avoid raw meat, uncooked cheese, and liter boxes and soil used by cats.  Take your prenatal vitamins.  Take 1500-2000 milligrams of calcium daily starting at the 20th week of pregnancy until you deliver your baby.  Try taking medicine that helps you poop (stool softener) as needed, and if your doctor approves. Eat more fiber by eating fresh fruit, vegetables, and whole grains. Drink enough fluids to keep your pee (urine) clear or pale yellow.  Take warm water baths (sitz baths) to soothe pain or discomfort caused by hemorrhoids. Use hemorrhoid cream if your doctor approves.  If you have puffy, bulging veins (varicose veins), wear support hose. Raise (elevate) your feet for 15 minutes, 3-4 times a day. Limit salt in your diet.  Avoid heavy  lifting, wear low heals, and sit up straight.  Rest with your legs raised if you have leg cramps or low back pain.  Visit your dentist if you have not gone during your pregnancy. Use a soft toothbrush to brush your teeth. Be gentle when you floss.  You can have sex (intercourse) unless your doctor tells you not to.  Go to your doctor visits. Get help if:  You feel dizzy.  You have mild cramps or pressure in your lower belly (abdomen).  You have a nagging pain in your belly area.  You continue to feel sick to your stomach (nauseous), throw up (vomit), or have watery poop (diarrhea).  You have bad smelling fluid coming from your vagina.  You have pain with peeing (urination). Get help right away if:  You have a fever.  You are leaking fluid from your vagina.  You have spotting or bleeding from your vagina.  You have severe belly cramping or pain.  You lose or gain weight rapidly.  You have trouble catching your breath and have chest pain.  You notice sudden or extreme puffiness (swelling) of your face, hands, ankles, feet, or legs.  You have not felt the baby move in over an hour.  You have severe headaches that do not go away with medicine.  You have vision changes. This information is not intended to replace advice given to you by your health care provider. Make sure you discuss any questions you have with your health care   provider. Document Released: 09/18/2009 Document Revised: 11/30/2015 Document Reviewed: 08/25/2012 Elsevier Interactive Patient Education  2017 [place] for Pregnant Women While you are pregnant, your body will require additional nutrition to help support your growing baby. It is recommended that you consume:  150 additional calories each day during your first trimester.  300 additional calories each day during your second trimester.  300 additional calories each day during your third trimester.  Eating a healthy,  well-balanced diet is very important for your health and for your baby's health. You also have a higher need for some vitamins and minerals, such as folic acid, calcium, iron, and vitamin D. What do I need to know about eating during pregnancy?  Do not try to lose weight or go on a diet during pregnancy.  Choose healthy, nutritious foods. Choose  of a sandwich with a glass of milk instead of a candy bar or a high-calorie sugar-sweetened beverage.  Limit your overall intake of foods that have "empty calories." These are foods that have little nutritional value, such as sweets, desserts, candies, sugar-sweetened beverages, and fried foods.  Eat a variety of foods, especially fruits and vegetables.  Take a prenatal vitamin to help meet the additional needs during pregnancy, specifically for folic acid, iron, calcium, and vitamin D.  Remember to stay active. Ask your health care provider for exercise recommendations that are specific to you.  Practice good food safety and cleanliness, such as washing your hands before you eat and after you prepare raw meat. This helps to prevent foodborne illnesses, such as listeriosis, that can be very dangerous for your baby. Ask your health care provider for more information about listeriosis. What does 150 extra calories look like? Healthy options for an additional 150 calories each day could be any of the following:  Plain low-fat yogurt (6-8 oz) with  cup of berries.  1 apple with 2 teaspoons of peanut butter.  Cut-up vegetables with  cup of hummus.  Low-fat chocolate milk (8 oz or 1 cup).  1 string cheese with 1 medium orange.   of a peanut butter and jelly sandwich on whole-wheat bread (1 tsp of peanut butter).  For 300 calories, you could eat two of those healthy options each day. What is a healthy amount of weight to gain? The recommended amount of weight for you to gain is based on your pre-pregnancy BMI. If your pre-pregnancy BMI  was:  Less than 18 (underweight), you should gain 28-40 lb.  18-24.9 (normal), you should gain 25-35 lb.  25-29.9 (overweight), you should gain 15-25 lb.  Greater than 30 (obese), you should gain 11-20 lb.  What if I am having twins or multiples? Generally, pregnant women who will be having twins or multiples may need to increase their daily calories by 300-600 calories each day. The recommended range for total weight gain is 25-54 lb, depending on your pre-pregnancy BMI. Talk with your health care provider for specific guidance about additional nutritional needs, weight gain, and exercise during your pregnancy. What foods can I eat? Grains Any grains. Try to choose whole grains, such as whole-wheat bread, oatmeal, or brown rice. Vegetables Any vegetables. Try to eat a variety of colors and types of vegetables to get a full range of vitamins and minerals. Remember to wash your vegetables well before eating. Fruits Any fruits. Try to eat a variety of colors and types of fruit to get a full range of vitamins and minerals. Remember to wash your fruits  well before eating. Meats and Other Protein Sources Lean meats, including chicken, [place], fish, and lean cuts of beef, veal, or pork. Make sure that all meats are cooked to "well done." Tofu. Tempeh. Beans. Eggs. Peanut butter and other nut butters. Seafood, such as shrimp, crab, and lobster. If you choose fish, select types that are higher in omega-3 fatty acids, including salmon, herring, mussels, trout, sardines, and pollock. Make sure that all meats are cooked to food-safe temperatures. Dairy Pasteurized milk and milk alternatives. Pasteurized yogurt and pasteurized cheese. Cottage cheese. Sour cream. Beverages Water. Juices that contain 100% fruit juice or vegetable juice. Caffeine-free teas and decaffeinated coffee. Drinks that contain caffeine are okay to drink, but it is better to avoid caffeine. Keep your total caffeine intake to less  than 200 mg each day (12 oz of coffee, tea, or soda) or as directed by your health care provider. Condiments Any pasteurized condiments. Sweets and Desserts Any sweets and desserts. Fats and Oils Any fats and oils. The items listed above may not be a complete list of recommended foods or beverages. Contact your dietitian for more options. What foods are not recommended? Vegetables Unpasteurized (raw) vegetable juices. Fruits Unpasteurized (raw) fruit juices. Meats and Other Protein Sources Cured meats that have nitrates, such as bacon, salami, and hotdogs. Luncheon meats, bologna, or other deli meats (unless they are reheated until they are steaming hot). Refrigerated pate, meat spreads from a meat counter, smoked seafood that is found in the refrigerated section of a store. Raw fish, such as sushi or sashimi. High mercury content fish, such as tilefish, shark, swordfish, and king mackerel. Raw meats, such as tuna or beef tartare. Undercooked meats and poultry. Make sure that all meats are cooked to food-safe temperatures. Dairy Unpasteurized (raw) milk and any foods that have raw milk in them. Soft cheeses, such as feta, queso blanco, queso fresco, Brie, Camembert cheeses, blue-veined cheeses, and Panela cheese (unless it is made with pasteurized milk, which must be stated on the label). Beverages Alcohol. Sugar-sweetened beverages, such as sodas, teas, or energy drinks. Condiments Homemade fermented foods and drinks, such as pickles, sauerkraut, or kombucha drinks. (Store-bought pasteurized versions of these are okay.) Other Salads that are made in the store, such as ham salad, chicken salad, egg salad, tuna salad, and seafood salad. The items listed above may not be a complete list of foods and beverages to avoid. Contact your dietitian for more information. This information is not intended to replace advice given to you by your health care provider. Make sure you discuss any questions you  have with your health care provider. Document Released: 04/08/2014 Document Revised: 11/30/2015 Document Reviewed: 12/07/2013 Elsevier Interactive Patient Education  [employer]2018 Elsevier Inc.

## 2018-03-30 NOTE — Progress Notes (Signed)
   PRENATAL VISIT NOTE  Subjective:  Cindy Levine is a 22 y.o. G1P0 at 3969w6d being seen today for ongoing prenatal care.  She is currently monitored for the following issues for this low-risk pregnancy and has Encounter for supervision of normal pregnancy, antepartum on their problem list.  Patient reports no complaints.  Contractions: Not present. Vag. Bleeding: None.  Movement: Present. Denies leaking of fluid.   The following portions of the patient's history were reviewed and updated as appropriate: allergies, current medications, past family history, past medical history, past social history, past surgical history and problem list. Problem list updated.  Objective:   Vitals:   03/30/18 1025  BP: 105/66  Pulse: 94  Weight: 152 lb (68.9 kg)    Fetal Status: Fetal Heart Rate (bpm): 140   Movement: Present     General:  Alert, oriented and cooperative. Patient is in no acute distress.  Skin: Skin is warm and dry. No rash noted.   Cardiovascular: Normal heart rate noted  Respiratory: Normal respiratory effort, no problems with respiration noted  Abdomen: Soft, gravid, appropriate for gestational age.  Pain/Pressure: Absent     Pelvic: Cervical exam deferred        Extremities: Normal range of motion.     Mental Status: Normal mood and affect. Normal behavior. Normal judgment and thought content.   Assessment and Plan:  Pregnancy: G1P0 at 7069w6d  1. Supervision of other normal pregnancy, antepartum S/p genetic testing all normal results  Preterm labor symptoms and general obstetric precautions including but not limited to vaginal bleeding, contractions, leaking of fluid and fetal movement were reviewed in detail with the patient. Please refer to After Visit Summary for other counseling recommendations.  Return in about 4 weeks (around 04/27/2018) for 2 hr GTT.  No future appointments.  Scheryl DarterJames Vance Hochmuth, MD

## 2018-04-02 ENCOUNTER — Telehealth: Payer: Self-pay

## 2018-04-02 NOTE — Telephone Encounter (Signed)
Attempted to contact, no answer, left vm ?

## 2018-04-06 ENCOUNTER — Other Ambulatory Visit (HOSPITAL_COMMUNITY): Payer: Self-pay

## 2018-04-07 ENCOUNTER — Telehealth (HOSPITAL_COMMUNITY): Payer: Self-pay | Admitting: *Deleted

## 2018-04-08 ENCOUNTER — Telehealth (HOSPITAL_COMMUNITY): Payer: Self-pay | Admitting: *Deleted

## 2018-04-08 NOTE — Telephone Encounter (Signed)
Pt returned call, name and DOB verified.  Normal microarray and AFP results from amniocentesis given.  Pt voiced understanding.

## 2018-04-27 ENCOUNTER — Ambulatory Visit (INDEPENDENT_AMBULATORY_CARE_PROVIDER_SITE_OTHER): Payer: BLUE CROSS/BLUE SHIELD | Admitting: Obstetrics and Gynecology

## 2018-04-27 ENCOUNTER — Other Ambulatory Visit: Payer: BLUE CROSS/BLUE SHIELD

## 2018-04-27 ENCOUNTER — Encounter: Payer: Self-pay | Admitting: Obstetrics and Gynecology

## 2018-04-27 DIAGNOSIS — Z3480 Encounter for supervision of other normal pregnancy, unspecified trimester: Secondary | ICD-10-CM

## 2018-04-27 NOTE — Patient Instructions (Signed)
Considering Waterbirth? Guide for patients at [facility]  Why consider waterbirth?  . Gentle birth for babies . Less pain medicine used in labor . May allow for passive descent/less pushing . May reduce perineal tears  . More mobility and instinctive maternal position changes . Increased maternal relaxation . Reduced blood pressure in labor  Is waterbirth safe? What are the risks of infection, drowning or other complications?  . Infection: o Very low risk (3.7 % for tub vs 4.8% for bed) o 7 in 8000 waterbirths with documented infection o Poorly cleaned equipment most common cause o Slightly lower group B strep transmission rate  . Drowning o Maternal:  - Very low risk   - Related to seizures or fainting o Newborn:  - Very low risk. No evidence of increased risk of respiratory problems in multiple large studies - Physiological protection from breathing under water - Avoid underwater birth if there are any fetal complications - Once baby's head is out of the water, keep it out.  . Birth complication o Some reports of cord trauma, but risk decreased by bringing baby to surface gradually o No evidence of increased risk of shoulder dystocia. Mothers can usually change positions faster in water than in a bed, possibly aiding the maneuvers to free the shoulder.   You must attend a Waterbirth class at [facility]  3rd Wednesday of every month from 7-9pm  Free  Register by calling [phone] or online at [email or web address]  Bring us the certificate from the class to your prenatal appointment  Meet with a midwife at 36 weeks to see if you can still plan a waterbirth and to sign the consent.   Purchase or rent the following supplies: You are responsible for providing all supplies listed above. **If you do not have all necessary supplies you cannot have a waterbirth.**   Water Birth Pool (Birth Pool in a Box or [place] for instance)  (Tubs start  ~$125)  Single-use disposable tub liner designed for your brand of tub  Electric drain pump to remove water (We recommend 792 gallon per hour or greater pump.)   New garden hose labeled "lead-free", "suitable for drinking water",  Separate garden hose to remove the dirty water  Fish net  Bathing suit top (optional)  Long-handled mirror (optional)  Places to purchase or rent supplies:   Yourwaterbirth.com for tub purchases and supplies  Waterbirthsolutions.com for tub purchases and supplies  [employer] (www.[email or web address].com) $275 for tub rental/set-up & take down/kit   [place] [employer] (http://www.[place].org/MeetUs.htm) Information regarding doulas (labor support) who provide pool rentals  Things that would prevent you from having a waterbirth:  Premature, <37wks  Previous cesarean birth  Presence of thick meconium-stained fluid  Multiple gestation (Twins, triplets, etc.)  Uncontrolled diabetes or gestational diabetes requiring medication  Hypertension requiring medication or diagnosis of pre-eclampsia  Heavy vaginal bleeding  Non-reassuring fetal heart rate  Active infection (MRSA, etc.). Group B Strep is NOT a contraindication for waterbirth.  If your labor has to be induced and induction method requires continuous monitoring of the baby's heart rate  Other risks/issues identified by your obstetrical provider  Please remember that birth is unpredictable. Under certain unforeseeable circumstances your provider may advise against giving birth in the tub. These decisions will be made on a case-by-case basis and with the safety of you and your baby as our highest priority.    

## 2018-04-27 NOTE — Progress Notes (Signed)
Pt is here for ROB/GTT. G1P0 [redacted]w[redacted]d.

## 2018-04-27 NOTE — Progress Notes (Signed)
   PRENATAL VISIT NOTE  Subjective:  Cindy Levine is a [name] being seen today for ongoing prenatal care.  She is currently monitored for the following issues for this low-risk pregnancy and has Encounter for supervision of normal pregnancy, antepartum on their problem list.  Patient reports no complaints.  Contractions: Not present. Vag. Bleeding: None.  Movement: Present. Denies leaking of fluid.   The following portions of the patient's history were reviewed and updated as appropriate: allergies, current medications, past family history, past medical history, past social history, past surgical history and problem list. Problem list updated.  Objective:   Vitals:   04/27/18 0832  BP: 110/72  Pulse: 96  Weight: 163 lb 1.6 oz (74 kg)    Fetal Status: Fetal Heart Rate (bpm): 136 Fundal Height: 26 cm Movement: Present     General:  Alert, oriented and cooperative. Patient is in no acute distress.  Skin: Skin is warm and dry. No rash noted.   Cardiovascular: Normal heart rate noted  Respiratory: Normal respiratory effort, no problems with respiration noted  Abdomen: Soft, gravid, appropriate for gestational age.  Pain/Pressure: Absent     Pelvic: Cervical exam deferred        Extremities: Normal range of motion.  Edema: Trace  Mental Status: Normal mood and affect. Normal behavior. Normal judgment and thought content.   Assessment and Plan:  Pregnancy: G1P0 at [redacted]w[redacted]d  1. Supervision of other normal pregnancy, antepartum Patient is doing well without complaints Third trimester labs today Patient still interested in waterbirth - Glucose Tolerance, 2 Hours w/1 Hour - CBC - RPR - HIV Antibody (routine testing w rflx)  Preterm labor symptoms and general obstetric precautions including but not limited to vaginal bleeding, contractions, leaking of fluid and fetal movement were reviewed in detail with the patient. Please refer to After Visit Summary for other  counseling recommendations.  No follow-ups on file.  No future appointments.  Catalina Antigua, MD

## 2018-04-28 LAB — GLUCOSE TOLERANCE, 2 HOURS W/ 1HR
GLUCOSE, 2 HOUR: 126 mg/dL (ref 65–152)
Glucose, 1 hour: 157 mg/dL (ref 65–179)
Glucose, Fasting: 84 mg/dL (ref 65–91)

## 2018-04-28 LAB — CBC
Hematocrit: 27.7 % — ABNORMAL LOW (ref 34.0–46.6)
Hemoglobin: 9.3 g/dL — ABNORMAL LOW (ref 11.1–15.9)
MCH: 28.8 pg (ref 26.6–33.0)
MCHC: 33.6 g/dL (ref 31.5–35.7)
MCV: 86 fL (ref 79–97)
PLATELETS: 147 10*3/uL — AB (ref 150–450)
RBC: 3.23 x10E6/uL — AB (ref 3.77–5.28)
RDW: 12.2 % — ABNORMAL LOW (ref 12.3–15.4)
WBC: 8 10*3/uL (ref 3.4–10.8)

## 2018-04-28 LAB — HIV ANTIBODY (ROUTINE TESTING W REFLEX): HIV Screen 4th Generation wRfx: NONREACTIVE

## 2018-04-28 LAB — RPR: RPR: NONREACTIVE

## 2018-05-04 ENCOUNTER — Encounter: Payer: Self-pay | Admitting: Obstetrics

## 2018-05-05 ENCOUNTER — Encounter: Payer: Self-pay | Admitting: *Deleted

## 2018-05-11 ENCOUNTER — Encounter: Payer: Self-pay | Admitting: Obstetrics

## 2018-05-11 ENCOUNTER — Ambulatory Visit (INDEPENDENT_AMBULATORY_CARE_PROVIDER_SITE_OTHER): Payer: BLUE CROSS/BLUE SHIELD | Admitting: Obstetrics

## 2018-05-11 VITALS — BP 120/65 | HR 103 | Wt 165.0 lb

## 2018-05-11 DIAGNOSIS — Z3482 Encounter for supervision of other normal pregnancy, second trimester: Secondary | ICD-10-CM

## 2018-05-11 DIAGNOSIS — Z3480 Encounter for supervision of other normal pregnancy, unspecified trimester: Secondary | ICD-10-CM

## 2018-05-11 NOTE — Progress Notes (Signed)
Subjective:  Muskan Bolla is a [name] being seen today for ongoing prenatal care.  She is currently monitored for the following issues for this low-risk pregnancy and has Encounter for supervision of normal pregnancy, antepartum on their problem list.  Patient reports no complaints.  Contractions: Not present. Vag. Bleeding: None.  Movement: Present. Denies leaking of fluid.   The following portions of the patient's history were reviewed and updated as appropriate: allergies, current medications, past family history, past medical history, past social history, past surgical history and problem list. Problem list updated.  Objective:   Vitals:   05/11/18 1019  BP: 120/65  Pulse: (!) 103  Weight: 165 lb (74.8 kg)    Fetal Status: Fetal Heart Rate (bpm): 140   Movement: Present     General:  Alert, oriented and cooperative. Patient is in no acute distress.  Skin: Skin is warm and dry. No rash noted.   Cardiovascular: Normal heart rate noted  Respiratory: Normal respiratory effort, no problems with respiration noted  Abdomen: Soft, gravid, appropriate for gestational age. Pain/Pressure: Present     Pelvic:  Cervical exam deferred        Extremities: Normal range of motion.  Edema: None  Mental Status: Normal mood and affect. Normal behavior. Normal judgment and thought content.   Urinalysis:      Assessment and Plan:  Pregnancy: G1P0 at [redacted]w[redacted]d  1. Supervision of other normal pregnancy, antepartum   Preterm labor symptoms and general obstetric precautions including but not limited to vaginal bleeding, contractions, leaking of fluid and fetal movement were reviewed in detail with the patient. Please refer to After Visit Summary for other counseling recommendations.  Return in about 2 weeks (around 05/25/2018) for ROB.   Brock Bad, MD

## 2018-05-25 ENCOUNTER — Encounter: Payer: Self-pay | Admitting: Obstetrics

## 2018-05-25 ENCOUNTER — Ambulatory Visit (INDEPENDENT_AMBULATORY_CARE_PROVIDER_SITE_OTHER): Payer: BLUE CROSS/BLUE SHIELD | Admitting: Obstetrics

## 2018-05-25 VITALS — BP 108/67 | HR 99 | Wt 166.4 lb

## 2018-05-25 DIAGNOSIS — Z3483 Encounter for supervision of other normal pregnancy, third trimester: Secondary | ICD-10-CM

## 2018-05-25 DIAGNOSIS — Z3A29 29 weeks gestation of pregnancy: Secondary | ICD-10-CM

## 2018-05-25 DIAGNOSIS — Z3480 Encounter for supervision of other normal pregnancy, unspecified trimester: Secondary | ICD-10-CM

## 2018-05-25 NOTE — Progress Notes (Signed)
Subjective:  Cindy BirchwoodRaushanda Cindy Levine is a 22 y.o. G1P0 at 5360w6d being seen today for ongoing prenatal care.  She is currently monitored for the following issues for this low-risk pregnancy and has Encounter for supervision of normal pregnancy, antepartum on their problem list.  Patient reports no complaints.  Contractions: Not present. Vag. Bleeding: None.  Movement: Present. Denies leaking of fluid.   The following portions of the patient's history were reviewed and updated as appropriate: allergies, current medications, past family history, past medical history, past social history, past surgical history and problem list. Problem list updated.  Objective:   Vitals:   05/25/18 0958  BP: 108/67  Pulse: 99  Weight: 166 lb 6.4 oz (75.5 kg)    Fetal Status:     Movement: Present     General:  Alert, oriented and cooperative. Patient is in no acute distress.  Skin: Skin is warm and dry. No rash noted.   Cardiovascular: Normal heart rate noted  Respiratory: Normal respiratory effort, no problems with respiration noted  Abdomen: Soft, gravid, appropriate for gestational age. Pain/Pressure: Absent     Pelvic:  Cervical exam deferred        Extremities: Normal range of motion.  Edema: None  Mental Status: Normal mood and affect. Normal behavior. Normal judgment and thought content.   Urinalysis:      Assessment and Plan:  Pregnancy: G1P0 at 3160w6d  1. Supervision of other normal pregnancy, antepartum   Preterm labor symptoms and general obstetric precautions including but not limited to vaginal bleeding, contractions, leaking of fluid and fetal movement were reviewed in detail with the patient. Please refer to After Visit Summary for other counseling recommendations.  Return in about 2 weeks (around 06/08/2018) for ROB.   Brock BadHarper, Charles A, MD

## 2018-05-25 NOTE — Progress Notes (Signed)
Pt is here for ROB. G1P0 562w6d.

## 2018-05-29 ENCOUNTER — Other Ambulatory Visit: Payer: Self-pay | Admitting: Obstetrics

## 2018-05-29 DIAGNOSIS — K219 Gastro-esophageal reflux disease without esophagitis: Secondary | ICD-10-CM

## 2018-05-29 MED ORDER — OMEPRAZOLE 20 MG PO CPDR
20.0000 mg | DELAYED_RELEASE_CAPSULE | Freq: Two times a day (BID) | ORAL | 5 refills | Status: DC
Start: 2018-05-29 — End: 2021-01-19

## 2018-06-08 ENCOUNTER — Encounter: Payer: BLUE CROSS/BLUE SHIELD | Admitting: Obstetrics

## 2018-06-15 ENCOUNTER — Encounter: Payer: Self-pay | Admitting: Obstetrics

## 2018-06-15 ENCOUNTER — Ambulatory Visit (INDEPENDENT_AMBULATORY_CARE_PROVIDER_SITE_OTHER): Payer: BLUE CROSS/BLUE SHIELD | Admitting: Obstetrics

## 2018-06-15 DIAGNOSIS — Z3400 Encounter for supervision of normal first pregnancy, unspecified trimester: Secondary | ICD-10-CM

## 2018-06-15 DIAGNOSIS — Z3403 Encounter for supervision of normal first pregnancy, third trimester: Secondary | ICD-10-CM

## 2018-06-15 NOTE — Progress Notes (Signed)
Subjective:  Cindy Levine is a 22 y.o. G1P0 at 2425w6d being seen today for ongoing prenatal care.  She is currently monitored for the following issues for this low-risk pregnancy and has Encounter for supervision of normal pregnancy, antepartum on their problem list.  Patient reports no complaints.  Contractions: Irritability. Vag. Bleeding: None.  Movement: Present. Denies leaking of fluid.   The following portions of the patient's history were reviewed and updated as appropriate: allergies, current medications, past family history, past medical history, past social history, past surgical history and problem list. Problem list updated.  Objective:   Vitals:   06/15/18 1039  BP: 113/67  Pulse: 98  Weight: 171 lb 6.4 oz (77.7 kg)    Fetal Status:     Movement: Present     General:  Alert, oriented and cooperative. Patient is in no acute distress.  Skin: Skin is warm and dry. No rash noted.   Cardiovascular: Normal heart rate noted  Respiratory: Normal respiratory effort, no problems with respiration noted  Abdomen: Soft, gravid, appropriate for gestational age. Pain/Pressure: Present     Pelvic:  Cervical exam deferred        Extremities: Normal range of motion.  Edema: Trace  Mental Status: Normal mood and affect. Normal behavior. Normal judgment and thought content.   Urinalysis:      Assessment and Plan:  Pregnancy: G1P0 at 325w6d  1. Supervision of normal first pregnancy, antepartum   Preterm labor symptoms and general obstetric precautions including but not limited to vaginal bleeding, contractions, leaking of fluid and fetal movement were reviewed in detail with the patient. Please refer to After Visit Summary for other counseling recommendations.  Return in about 2 weeks (around 06/29/2018) for ROB.   Brock BadHarper, Shandie Bertz A, MD

## 2018-06-15 NOTE — Patient Instructions (Signed)
Tdap Vaccine (Tetanus, Diphtheria and Pertussis): What You Need to Know 1. Why get vaccinated? Tetanus, diphtheria and pertussis are very serious diseases. Tdap vaccine can protect us from these diseases. And, Tdap vaccine given to pregnant women can protect newborn babies against pertussis. TETANUS (Lockjaw) is rare in the [place] today. It causes painful muscle tightening and stiffness, usually all over the body.  It can lead to tightening of muscles in the head and neck so you can't open your mouth, swallow, or sometimes even breathe. Tetanus kills about 1 out of 10 people who are infected even after receiving the best medical care.  DIPHTHERIA is also rare in the [place] today. It can cause a thick coating to form in the back of the throat.  It can lead to breathing problems, heart failure, paralysis, and death.  PERTUSSIS (Whooping Cough) causes severe coughing spells, which can cause difficulty breathing, vomiting and disturbed sleep.  It can also lead to weight loss, incontinence, and rib fractures. Up to 2 in 100 adolescents and 5 in 100 adults with pertussis are hospitalized or have complications, which could include pneumonia or death.  These diseases are caused by bacteria. Diphtheria and pertussis are spread from person to person through secretions from coughing or sneezing. Tetanus enters the body through cuts, scratches, or wounds. Before vaccines, as many as 200,000 cases of diphtheria, 200,000 cases of pertussis, and hundreds of cases of tetanus, were reported in the [place] each year. Since vaccination began, reports of cases for tetanus and diphtheria have dropped by about 99% and for pertussis by about 80%. 2. Tdap vaccine Tdap vaccine can protect adolescents and adults from tetanus, diphtheria, and pertussis. One dose of Tdap is routinely given at age 11 or 12. People who did not get Tdap at that age should get it as soon as possible. Tdap is especially  important for healthcare professionals and anyone having close contact with a baby younger than 12 months. Pregnant women should get a dose of Tdap during every pregnancy, to protect the newborn from pertussis. Infants are most at risk for severe, life-threatening complications from pertussis. Another vaccine, called Td, protects against tetanus and diphtheria, but not pertussis. A Td booster should be given every 10 years. Tdap may be given as one of these boosters if you have never gotten Tdap before. Tdap may also be given after a severe cut or burn to prevent tetanus infection. Your doctor or the person giving you the vaccine can give you more information. Tdap may safely be given at the same time as other vaccines. 3. Some people should not get this vaccine  A person who has ever had a life-threatening allergic reaction after a previous dose of any diphtheria, tetanus or pertussis containing vaccine, OR has a severe allergy to any part of this vaccine, should not get Tdap vaccine. Tell the person giving the vaccine about any severe allergies.  Anyone who had coma or long repeated seizures within 7 days after a childhood dose of DTP or DTaP, or a previous dose of Tdap, should not get Tdap, unless a cause other than the vaccine was found. They can still get Td.  Talk to your doctor if you: ? have seizures or another nervous system problem, ? had severe pain or swelling after any vaccine containing diphtheria, tetanus or pertussis, ? ever had a condition called Guillain-Barr Syndrome (GBS), ? aren't feeling well on the day the shot is scheduled. 4. Risks With any medicine, including   vaccines, there is a chance of side effects. These are usually mild and go away on their own. Serious reactions are also possible but are rare. Most people who get Tdap vaccine do not have any problems with it. Mild problems following Tdap: (Did not interfere with activities)  Pain where the shot was given (about  3 in 4 adolescents or 2 in 3 adults)  Redness or swelling where the shot was given (about 1 person in 5)  Mild fever of at least 100.4F (up to about 1 in 25 adolescents or 1 in 100 adults)  Headache (about 3 or 4 people in 10)  Tiredness (about 1 person in 3 or 4)  Nausea, vomiting, diarrhea, stomach ache (up to 1 in 4 adolescents or 1 in 10 adults)  Chills, sore joints (about 1 person in 10)  Body aches (about 1 person in 3 or 4)  Rash, swollen glands (uncommon)  Moderate problems following Tdap: (Interfered with activities, but did not require medical attention)  Pain where the shot was given (up to 1 in 5 or 6)  Redness or swelling where the shot was given (up to about 1 in 16 adolescents or 1 in 12 adults)  Fever over 102F (about 1 in 100 adolescents or 1 in 250 adults)  Headache (about 1 in 7 adolescents or 1 in 10 adults)  Nausea, vomiting, diarrhea, stomach ache (up to 1 or 3 people in 100)  Swelling of the entire arm where the shot was given (up to about 1 in 500).  Severe problems following Tdap: (Unable to perform usual activities; required medical attention)  Swelling, severe pain, bleeding and redness in the arm where the shot was given (rare).  Problems that could happen after any vaccine:  People sometimes faint after a medical procedure, including vaccination. Sitting or lying down for about 15 minutes can help prevent fainting, and injuries caused by a fall. Tell your doctor if you feel dizzy, or have vision changes or ringing in the ears.  Some people get severe pain in the shoulder and have difficulty moving the arm where a shot was given. This happens very rarely.  Any medication can cause a severe allergic reaction. Such reactions from a vaccine are very rare, estimated at fewer than 1 in a million doses, and would happen within a few minutes to a few hours after the vaccination. As with any medicine, there is a very remote chance of a vaccine  causing a serious injury or death. The safety of vaccines is always being monitored. For more information, visit: www.cdc.gov/vaccinesafety/ 5. What if there is a serious problem? What should I look for? Look for anything that concerns you, such as signs of a severe allergic reaction, very high fever, or unusual behavior. Signs of a severe allergic reaction can include hives, swelling of the face and throat, difficulty breathing, a fast heartbeat, dizziness, and weakness. These would usually start a few minutes to a few hours after the vaccination. What should I do?  If you think it is a severe allergic reaction or other emergency that can't wait, call 9-1-1 or get the person to the nearest hospital. Otherwise, call your doctor.  Afterward, the reaction should be reported to the Vaccine Adverse Event Reporting System (VAERS). Your doctor might file this report, or you can do it yourself through the VAERS web site at www.vaers.hhs.gov, or by calling 1-800-822-7967. ? VAERS does not give medical advice. 6. The National Vaccine Injury Compensation Program The National   Vaccine Injury Compensation Program (VICP) is a federal program that was created to compensate people who may have been injured by certain vaccines. Persons who believe they may have been injured by a vaccine can learn about the program and about filing a claim by calling 1-800-338-2382 or visiting the VICP website at www.hrsa.gov/vaccinecompensation. There is a time limit to file a claim for compensation. 7. How can I learn more?  Ask your doctor. He or she can give you the vaccine package insert or suggest other sources of information.  Call your local or state health department.  Contact the Centers for Disease Control and Prevention (CDC): ? Call 1-800-232-4636 (1-800-CDC-INFO) or ? Visit CDC's website at www.cdc.gov/vaccines CDC Tdap Vaccine VIS (08/31/13) This information is not intended to replace advice given to you by your  health care provider. Make sure you discuss any questions you have with your health care provider. Document Released: 12/24/2011 Document Revised: 03/14/2016 Document Reviewed: 03/14/2016 Elsevier Interactive Patient Education  2017 Elsevier Inc.  

## 2018-06-29 ENCOUNTER — Ambulatory Visit (INDEPENDENT_AMBULATORY_CARE_PROVIDER_SITE_OTHER): Payer: BLUE CROSS/BLUE SHIELD | Admitting: Obstetrics

## 2018-06-29 ENCOUNTER — Encounter: Payer: Self-pay | Admitting: Obstetrics

## 2018-06-29 VITALS — BP 120/74 | HR 88 | Wt 180.3 lb

## 2018-06-29 DIAGNOSIS — L309 Dermatitis, unspecified: Secondary | ICD-10-CM

## 2018-06-29 DIAGNOSIS — Z3403 Encounter for supervision of normal first pregnancy, third trimester: Secondary | ICD-10-CM

## 2018-06-29 DIAGNOSIS — Z23 Encounter for immunization: Secondary | ICD-10-CM

## 2018-06-29 DIAGNOSIS — Z3400 Encounter for supervision of normal first pregnancy, unspecified trimester: Secondary | ICD-10-CM

## 2018-06-29 MED ORDER — TRIAMCINOLONE ACETONIDE 0.1 % EX OINT: TOPICAL_OINTMENT | CUTANEOUS | 2 refills | Status: DC

## 2018-06-29 NOTE — Progress Notes (Signed)
Subjective:  Cindy Levine is a 22 y.o. G1P0 at 4264w6d being seen today for ongoing prenatal care.  She is currently monitored for the following issues for this low-risk pregnancy and has Encounter for supervision of normal pregnancy, antepartum and Dermatitis on their problem list.  Patient reports heartburn.  Contractions: Not present. Vag. Bleeding: None.  Movement: Present. Denies leaking of fluid.   The following portions of the patient's history were reviewed and updated as appropriate: allergies, current medications, past family history, past medical history, past social history, past surgical history and problem list. Problem list updated.  Objective:   Vitals:   06/29/18 1034  BP: 120/74  Pulse: 88  Weight: 180 lb 4.8 oz (81.8 kg)    Fetal Status:     Movement: Present     General:  Alert, oriented and cooperative. Patient is in no acute distress.  Skin: Skin is warm and dry. No rash noted.   Cardiovascular: Normal heart rate noted  Respiratory: Normal respiratory effort, no problems with respiration noted  Abdomen: Soft, gravid, appropriate for gestational age. Pain/Pressure: Present     Pelvic:  Cervical exam deferred        Extremities: Normal range of motion.  Edema: Trace  Mental Status: Normal mood and affect. Normal behavior. Normal judgment and thought content.   Urinalysis:      Assessment and Plan:  Pregnancy: G1P0 at 4864w6d  1. Supervision of normal first pregnancy, antepartum Rx: - Tdap vaccine greater than or equal to 7yo IM  2. Dermatitis Rx: - triamcinolone ointment (KENALOG) 0.1 %; Apply topically to affected areas for up to 14 days of treatment.  Dispense: 60 g; Refill: 2  Preterm labor symptoms and general obstetric precautions including but not limited to vaginal bleeding, contractions, leaking of fluid and fetal movement were reviewed in detail with the patient. Please refer to After Visit Summary for other counseling recommendations.  Return in  about 1 week (around 07/06/2018) for ROB.   Brock BadHarper, Newell Wafer A, MD

## 2018-07-10 ENCOUNTER — Encounter: Payer: Self-pay | Admitting: Obstetrics

## 2018-07-10 ENCOUNTER — Other Ambulatory Visit (HOSPITAL_COMMUNITY)
Admission: RE | Admit: 2018-07-10 | Discharge: 2018-07-10 | Disposition: A | Discharge status: Home / Self Care | Payer: BLUE CROSS/BLUE SHIELD | Source: Ambulatory Visit | Attending: Obstetrics | Admitting: Obstetrics

## 2018-07-10 ENCOUNTER — Ambulatory Visit (INDEPENDENT_AMBULATORY_CARE_PROVIDER_SITE_OTHER): Payer: BLUE CROSS/BLUE SHIELD | Admitting: Obstetrics

## 2018-07-10 DIAGNOSIS — Z3400 Encounter for supervision of normal first pregnancy, unspecified trimester: Secondary | ICD-10-CM | POA: Diagnosis present

## 2018-07-10 DIAGNOSIS — Z3403 Encounter for supervision of normal first pregnancy, third trimester: Secondary | ICD-10-CM

## 2018-07-10 LAB — OB RESULTS CONSOLE GC/CHLAMYDIA: Gonorrhea: NEGATIVE

## 2018-07-10 NOTE — Progress Notes (Signed)
Subjective:  Cindy Levine is a 23 y.o. G1P0 at [redacted]w[redacted]d being seen today for ongoing prenatal care.  She is currently monitored for the following issues for this low-risk pregnancy and has Encounter for supervision of normal pregnancy, antepartum and Dermatitis on their problem list.  Patient reports no complaints.  Contractions: Not present. Vag. Bleeding: None.  Movement: Present. Denies leaking of fluid.   The following portions of the patient's history were reviewed and updated as appropriate: allergies, current medications, past family history, past medical history, past social history, past surgical history and problem list. Problem list updated.  Objective:   Vitals:   07/10/18 0911  BP: 116/71  Pulse: 98  Weight: 178 lb 9.6 oz (81 kg)    Fetal Status:     Movement: Present     General:  Alert, oriented and cooperative. Patient is in no acute distress.  Skin: Skin is warm and dry. No rash noted.   Cardiovascular: Normal heart rate noted  Respiratory: Normal respiratory effort, no problems with respiration noted  Abdomen: Soft, gravid, appropriate for gestational age. Pain/Pressure: Present     Pelvic:  Cervical exam deferred        Extremities: Normal range of motion.  Edema: Trace  Mental Status: Normal mood and affect. Normal behavior. Normal judgment and thought content.   Urinalysis:      Assessment and Plan:  Pregnancy: G1P0 at [redacted]w[redacted]d  1. Supervision of normal first pregnancy, antepartum Rx: - Strep Gp B NAA - Cervicovaginal ancillary only( )  Preterm labor symptoms and general obstetric precautions including but not limited to vaginal bleeding, contractions, leaking of fluid and fetal movement were reviewed in detail with the patient. Please refer to After Visit Summary for other counseling recommendations.  Return in about 1 week (around 07/17/2018) for ROB.   Brock Bad, MD

## 2018-07-10 NOTE — Progress Notes (Signed)
Pt presents for ROB/GBS/GC/CT. 

## 2018-07-12 LAB — STREP GP B NAA: STREP GROUP B AG: NEGATIVE

## 2018-07-13 ENCOUNTER — Inpatient Hospital Stay (HOSPITAL_COMMUNITY)
Admission: AD | Admit: 2018-07-13 | Discharge: 2018-07-13 | Disposition: A | Discharge status: Home / Self Care | Payer: BLUE CROSS/BLUE SHIELD | Source: Ambulatory Visit | Attending: Family Medicine | Admitting: Family Medicine

## 2018-07-13 ENCOUNTER — Encounter (HOSPITAL_COMMUNITY): Payer: Self-pay | Admitting: *Deleted

## 2018-07-13 ENCOUNTER — Other Ambulatory Visit: Payer: Self-pay

## 2018-07-13 DIAGNOSIS — O4703 False labor before 37 completed weeks of gestation, third trimester: Secondary | ICD-10-CM | POA: Diagnosis not present

## 2018-07-13 DIAGNOSIS — Z87891 Personal history of nicotine dependence: Secondary | ICD-10-CM | POA: Insufficient documentation

## 2018-07-13 DIAGNOSIS — R109 Unspecified abdominal pain: Secondary | ICD-10-CM | POA: Diagnosis present

## 2018-07-13 DIAGNOSIS — Z3A36 36 weeks gestation of pregnancy: Secondary | ICD-10-CM | POA: Diagnosis not present

## 2018-07-13 DIAGNOSIS — Z3A37 37 weeks gestation of pregnancy: Secondary | ICD-10-CM | POA: Diagnosis not present

## 2018-07-13 DIAGNOSIS — O479 False labor, unspecified: Secondary | ICD-10-CM

## 2018-07-13 DIAGNOSIS — O471 False labor at or after 37 completed weeks of gestation: Secondary | ICD-10-CM | POA: Insufficient documentation

## 2018-07-13 LAB — CERVICOVAGINAL ANCILLARY ONLY
Chlamydia: NEGATIVE
Neisseria Gonorrhea: NEGATIVE

## 2018-07-13 NOTE — MAU Provider Note (Addendum)
History     CSN: [id number]  Arrival date and time: 07/13/18 1613   First Provider Initiated Contact with Patient 07/13/18 1701      Chief Complaint  Patient presents with  . Abdominal Pain   Pt is a 23- year-old G1P0, 37wks, presenting with abdominal cramping. The cramps were in the upper abdomen, 5 minutes apart, and began at 3 pm while pt was at work.  Pain improved with ambulation. Pain has now subsided, except for intermittent, lower abdominal cramps.  Pt denies vaginal bleeding and fluid leakage. She is feeling baby move regularly.    Abdominal Pain  Pertinent negatives include no fever.    OB History    Gravida  1   Para      Term      Preterm      AB      Living        SAB      TAB      Ectopic      Multiple      Live Births              History reviewed. No pertinent past medical history.  History reviewed. No pertinent surgical history.  Family History  Problem Relation Age of Onset  . Diabetes Father   . Prostate cancer Maternal Grandfather   . Hypertension Maternal Grandfather   . Hypertension Paternal Grandmother   . Diabetes Paternal Grandmother     Social History   Tobacco Use  . Smoking status: Former Smoker    Types: Cigarettes    Last attempt to quit: 2019    Years since quitting: 1.0  . Smokeless tobacco: Never Used  Substance Use Topics  . Alcohol use: No  . Drug use: No    Allergies:  Allergies  Allergen Reactions  . Fish Allergy Shortness Of Breath and Swelling  . Shellfish Allergy Shortness Of Breath and Swelling  . Peanut-Containing Drug Products Other (See Comments)    Allergy test said almonds     Medications Prior to Admission  Medication Sig Dispense Refill Last Dose  . omeprazole (PRILOSEC) 20 MG capsule Take 1 capsule (20 mg total) by mouth 2 (two) times daily before a meal. 60 capsule 5 Taking  . Prenatal Vit-Fe Phos-FA-Omega (VITAFOL GUMMIES) 3.33-0.333-34.8 MG CHEW Chew 3 tablets by mouth daily.  11  Taking  . triamcinolone ointment (KENALOG) 0.1 % Apply topically to affected areas for up to 14 days of treatment. 60 g 2 Taking    Review of Systems  Constitutional: Negative for chills and fever.  HENT: Positive for congestion and sore throat.   Respiratory: Negative for shortness of breath (with exertion).   Cardiovascular: Negative for chest pain. Leg swelling: positional-on lying down.  Gastrointestinal: Positive for abdominal pain.  Genitourinary: Negative for difficulty urinating and vaginal bleeding.  Musculoskeletal: Negative for joint swelling (ankles).  Skin: Negative for rash.   Physical Exam   Blood pressure 114/74, pulse 88, temperature 98.2 F (36.8 C), temperature source Oral, resp. rate 16, weight 80.9 kg, last menstrual period 10/28/2017, SpO2 100 %.  Physical Exam  Constitutional: She is oriented to person, place, and time. She appears well-developed and well-nourished. No distress.  HENT:  Head: Normocephalic and atraumatic.  Mouth/Throat: Posterior oropharyngeal erythema present. Tonsillar abscesses: mild.  Eyes: Conjunctivae and EOM are normal.  Neck: Normal range of motion. Neck supple.  Cardiovascular: Normal rate and regular rhythm.  Respiratory: Effort normal and breath sounds normal. She has  no wheezes. She has no rhonchi. She has no rales.  GI: Soft. Bowel sounds are normal. She exhibits no distension. There is no abdominal tenderness. There is no rebound and no guarding.  Genitourinary:     Musculoskeletal: Normal range of motion.     Right ankle: She exhibits swelling (1+).     Left ankle: She exhibits swelling (1+).  Neurological: She is alert and oriented to person, place, and time.  Skin: Skin is warm, dry and intact.  Psychiatric: She has a normal mood and affect. Her behavior is normal.  Dilation: 1 Effacement (%): Thick Exam by:: Sheran Fava, MD   FHT: 130, moderate variability, +acels, no decels Toco: Occasional with irritability   MAU  Course  Procedures  MDM -Cervical exam  -NST reactive   Assessment and Plan   1. False labor   2. [redacted] weeks gestation of pregnancy    Braxton-Hicks Contractions. Patient without regular contraction pattern on toco. Cervix with minimal dilation and patient no longer having contractions while in MAU.  - Discharge home with instructions to return if fetal movement decreases, contractions are less than 5 minutes apart and getting stronger, if water breaks or if experiencing vaginal bleeding.  Isabel Caprice 07/13/2018, 5:28 PM   OB FELLOW MAU DISCHARGE ATTESTATION  I have seen and examined this patient; I agree with above documentation in the student's note and edited as appropriate.    Marcy Siren, D.O. OB Fellow  07/13/2018, 6:48 PM

## 2018-07-13 NOTE — MAU Note (Signed)
Urine in lab 

## 2018-07-13 NOTE — Discharge Instructions (Signed)
Call your OB Clinic or go to [facility] if:  You begin to have strong, frequent contractions  Your water breaks.  Sometimes it is a big gush of fluid, sometimes it is just a trickle that keeps getting your panties wet or running down your legs  You have vaginal bleeding.  It is normal to have a small amount of spotting if your cervix was checked.   You don't feel your baby moving like normal.  If you don't, get you something to eat and drink and lay down and focus on feeling your baby move.  You should feel at least 10 movements in 2 hours.  If you don't, you should call the office or go to [facility]. See directions below to measure fetal movement.   Fetal Kick Counts: Lay down on your left side in a quiet room. You should feel baby move 10 times in 2 hours. If you don't feel baby move that much, eat something sugary and drink some water. Try again. If still not feeling that much movement, come to [facility].

## 2018-07-13 NOTE — MAU Note (Signed)
Earlier today, at work she was having some prettier bad cramps, no longer having any pain.  No bleeding or leaking.

## 2018-07-20 ENCOUNTER — Ambulatory Visit (INDEPENDENT_AMBULATORY_CARE_PROVIDER_SITE_OTHER): Payer: BLUE CROSS/BLUE SHIELD | Admitting: Advanced Practice Midwife

## 2018-07-20 VITALS — BP 122/76 | HR 108 | Wt 183.0 lb

## 2018-07-20 DIAGNOSIS — Z3400 Encounter for supervision of normal first pregnancy, unspecified trimester: Secondary | ICD-10-CM

## 2018-07-20 NOTE — Progress Notes (Signed)
   PRENATAL VISIT NOTE  Subjective:  Cindy Levine is a 23 y.o. G1P0 at [redacted]w[redacted]d being seen today for ongoing prenatal care.  She is currently monitored for the following issues for this low-risk pregnancy and has Encounter for supervision of normal pregnancy, antepartum and Dermatitis on their problem list.  Patient reports no complaints.  Contractions: Not present. Vag. Bleeding: None.  Movement: Present. Denies leaking of fluid.   The following portions of the patient's history were reviewed and updated as appropriate: allergies, current medications, past family history, past medical history, past social history, past surgical history and problem list. Problem list updated.  Objective:   Vitals:   07/20/18 1358  BP: 122/76  Pulse: (!) 108  Weight: 83 kg    Fetal Status: Fetal Heart Rate (bpm): 132   Movement: Present     General:  Alert, oriented and cooperative. Patient is in no acute distress.  Skin: Skin is warm and dry. No rash noted.   Cardiovascular: Normal heart rate noted  Respiratory: Normal respiratory effort, no problems with respiration noted  Abdomen: Soft, gravid, appropriate for gestational age.  Pain/Pressure: Absent     Pelvic: Cervical exam deferred        Extremities: Normal range of motion.  Edema: Mild pitting, slight indentation  Mental Status: Normal mood and affect. Normal behavior. Normal judgment and thought content.   Assessment and Plan:  Pregnancy: G1P0 at [redacted]w[redacted]d  1. Supervision of normal first pregnancy, antepartum --Anticipatory guidance about next visits/weeks of pregnancy given. --Reviewed pt plans for waterbirth.  Plans to use doula service.  Has taken class but needs to bring certificate.    Preterm labor symptoms and general obstetric precautions including but not limited to vaginal bleeding, contractions, leaking of fluid and fetal movement were reviewed in detail with the patient. Please refer to After Visit Summary for other counseling  recommendations.  Return in about 1 week (around 07/27/2018).  Future Appointments  Date Time Provider Department Center  07/30/2018 10:30 AM Sharyon Cable, CNM [facility] None    Sharen Counter, CNM

## 2018-07-20 NOTE — Patient Instructions (Signed)

## 2018-07-20 NOTE — Progress Notes (Signed)
Patient reports good fetal movement, denies pain. 

## 2018-07-30 ENCOUNTER — Encounter: Payer: Self-pay | Admitting: *Deleted

## 2018-07-30 ENCOUNTER — Telehealth (HOSPITAL_COMMUNITY): Payer: Self-pay | Admitting: *Deleted

## 2018-07-30 ENCOUNTER — Encounter: Payer: Self-pay | Admitting: Certified Nurse Midwife

## 2018-07-30 ENCOUNTER — Ambulatory Visit (INDEPENDENT_AMBULATORY_CARE_PROVIDER_SITE_OTHER): Payer: BLUE CROSS/BLUE SHIELD | Admitting: Certified Nurse Midwife

## 2018-07-30 DIAGNOSIS — Z3403 Encounter for supervision of normal first pregnancy, third trimester: Secondary | ICD-10-CM

## 2018-07-30 DIAGNOSIS — Z3400 Encounter for supervision of normal first pregnancy, unspecified trimester: Secondary | ICD-10-CM

## 2018-07-30 NOTE — Progress Notes (Signed)
Pt declines cervix check

## 2018-07-30 NOTE — Progress Notes (Signed)
   PRENATAL VISIT NOTE  Subjective:  Cindy Levine is a 23 y.o. G1P0 at [redacted]w[redacted]d being seen today for ongoing prenatal care.  She is currently monitored for the following issues for this low-risk pregnancy and has Encounter for supervision of normal pregnancy, antepartum and Dermatitis on their problem list.  Patient reports no complaints.  Contractions: Not present. Vag. Bleeding: None.  Movement: Present. Denies leaking of fluid.   The following portions of the patient's history were reviewed and updated as appropriate: allergies, current medications, past family history, past medical history, past social history, past surgical history and problem list. Problem list updated.  Objective:   Vitals:   07/30/18 1031  BP: 117/71  Pulse: 80  Weight: 188 lb (85.3 kg)    Fetal Status: Fetal Heart Rate (bpm): 144 Fundal Height: 37 cm Movement: Present  Presentation: Vertex  General:  Alert, oriented and cooperative. Patient is in no acute distress.  Skin: Skin is warm and dry. No rash noted.   Cardiovascular: Normal heart rate noted  Respiratory: Normal respiratory effort, no problems with respiration noted  Abdomen: Soft, gravid, appropriate for gestational age.  Pain/Pressure: Absent     Pelvic: Cervical exam deferred        Extremities: Normal range of motion.  Edema: Trace  Mental Status: Normal mood and affect. Normal behavior. Normal judgment and thought content.   Assessment and Plan:  Pregnancy: G1P0 at [redacted]w[redacted]d  1. Supervision of normal first pregnancy, antepartum - Patient doing well, no complaints - Anticipatory guidance on upcoming appointments - Educated and discussed EPO, RRT and IC to induce contractions  - Discussed membrane sweep at next appointment when patient is [redacted]w[redacted]d and antenatal testing next week  - IOL for 41 weeks, orders placed   Term labor symptoms and general obstetric precautions including but not limited to vaginal bleeding, contractions, leaking of fluid  and fetal movement were reviewed in detail with the patient. Please refer to After Visit Summary for other counseling recommendations.  Return in about 5 days (around 08/04/2018) for ROB, NST.  Future Appointments  Date Time Provider Department Center  08/05/2018 10:45 AM Gerrit Heck, CNM [facility] None  08/07/2018 10:30 AM Adam Phenix, MD [facility] None  08/11/2018 12:00 AM [facility] None    Sharyon Cable, CNM

## 2018-07-30 NOTE — Telephone Encounter (Signed)
Preadmission screen  

## 2018-07-30 NOTE — Patient Instructions (Signed)
Why consider waterbirth?  . Gentle birth for babies . Less pain medicine used in labor . May allow for passive descent/less pushing . May reduce perineal tears  . More mobility and instinctive maternal position changes . Increased maternal relaxation . Reduced blood pressure in labor  Is waterbirth safe? What are the risks of infection, drowning or other complications?  . Infection: o Very low risk (3.7 % for tub vs 4.8% for bed) o 7 in 8000 waterbirths with documented infection o Poorly cleaned equipment most common cause o Slightly lower group B strep transmission rate  . Drowning o Maternal:  - Very low risk   - Related to seizures or fainting o Newborn:  - Very low risk. No evidence of increased risk of respiratory problems in multiple large studies - Physiological protection from breathing under water - Avoid underwater birth if there are any fetal complications - Once baby's head is out of the water, keep it out.  . Birth complication o Some reports of cord trauma, but risk decreased by bringing baby to surface gradually o No evidence of increased risk of shoulder dystocia. Mothers can usually change positions faster in water than in a bed, possibly aiding the maneuvers to free the shoulder.   If you plan a waterbirth before August 30, 2018: Purchase or rent the following supplies: You are responsible for providing all supplies listed above. **If you do not have all necessary supplies you cannot have a waterbirth.**   Water Birth Pool (Birth Pool in a Box or [place] for instance)  (Tubs start ~$125)  Single-use disposable tub liner designed for your brand of tub  Electric drain pump to remove water (We recommend 792 gallon per hour or greater pump.)   New garden hose labeled "lead-free", "suitable for drinking water",  Separate garden hose to remove the dirty water  Fish net  Bathing suit top (optional)  Long-handled mirror (optional)  Places to purchase  or rent supplies:   [employer] for tub purchases and supplies  [employer] for tub purchases and supplies  [employer] (www.thelaborladies.com) $275 for tub rental/set-up & take down/kit   [employer] (http://www.fleming.com/.htm) Information regarding doulas (labor support) who provide pool rentals  Things that would prevent you from having a waterbirth:  Premature, <37wks  Previous cesarean birth  Presence of thick meconium-stained fluid  Multiple gestation (Twins, triplets, etc.)  Uncontrolled diabetes or gestational diabetes requiring medication  Hypertension requiring medication or diagnosis of pre-eclampsia  Heavy vaginal bleeding  Non-reassuring fetal heart rate  Active infection (MRSA, etc.). Group B Strep is NOT a contraindication for waterbirth.  If your labor has to be induced and induction method requires continuous monitoring of the baby's heart rate  Other risks/issues identified by your obstetrical provider  Please remember that birth is unpredictable. Under certain unforeseeable circumstances your provider may advise against giving birth in the tub. These decisions will be made on a case-by-case basis and with the safety of you and your baby as our highest priority.

## 2018-08-05 ENCOUNTER — Ambulatory Visit (INDEPENDENT_AMBULATORY_CARE_PROVIDER_SITE_OTHER): Payer: BLUE CROSS/BLUE SHIELD

## 2018-08-05 VITALS — BP 127/82 | HR 96 | Wt 189.7 lb

## 2018-08-05 DIAGNOSIS — Z3403 Encounter for supervision of normal first pregnancy, third trimester: Secondary | ICD-10-CM

## 2018-08-05 DIAGNOSIS — Z3400 Encounter for supervision of normal first pregnancy, unspecified trimester: Secondary | ICD-10-CM

## 2018-08-05 DIAGNOSIS — O99013 Anemia complicating pregnancy, third trimester: Secondary | ICD-10-CM

## 2018-08-05 NOTE — Patient Instructions (Signed)
Labor Induction    Labor induction is when steps are taken to cause a pregnant woman to begin the labor process. Most women go into labor on their own between 37 weeks and 42 weeks of pregnancy. When this does not happen or when there is a medical need for labor to begin, steps may be taken to induce labor. Labor induction causes a pregnant woman's uterus to contract. It also causes the cervix to soften (ripen), open (dilate), and thin out (efface). Usually, labor is not induced before 39 weeks of pregnancy unless there is a medical reason to do so. Your health care provider will determine if labor induction is needed.  Before inducing labor, your health care provider will consider a number of factors, including:  · Your medical condition and your baby's.  · How many weeks along you are in your pregnancy.  · How mature your baby's lungs are.  · The condition of your cervix.  · The position of your baby.  · The size of your birth canal.  What are some reasons for labor induction?  Labor may be induced if:  · Your health or your baby's health is at risk.  · Your pregnancy is overdue by 1 week or more.  · Your water breaks but labor does not start on its own.  · There is a low amount of amniotic fluid around your baby.  You may also choose (elect) to have labor induced at a certain time. Generally, elective labor induction is done no earlier than 39 weeks of pregnancy.  What methods are used for labor induction?  Methods used for labor induction include:  · Prostaglandin medicine. This medicine starts contractions and causes the cervix to dilate and ripen. It can be taken by mouth (orally) or by being inserted into the vagina (suppository).  · Inserting a small, thin tube (catheter) with a balloon into the vagina and then expanding the balloon with water to dilate the cervix.  · Stripping the membranes. In this method, your health care provider gently separates amniotic sac tissue from the cervix. This causes the  cervix to stretch, which in turn causes the release of a hormone called progesterone. The hormone causes the uterus to contract. This procedure is often done during an office visit, after which you will be sent home to wait for contractions to begin.  · Breaking the water. In this method, your health care provider uses a small instrument to make a small hole in the amniotic sac. This eventually causes the amniotic sac to break. Contractions should begin after a few hours.  · Medicine to trigger or strengthen contractions. This medicine is given through an IV that is inserted into a vein in your arm.  Except for membrane stripping, which can be done in a clinic, labor induction is done in the hospital so that you and your baby can be carefully monitored.  How long does it take for labor to be induced?  The length of time it takes to induce labor depends on how ready your body is for labor. Some inductions can take up to 2-3 days, while others may take less than a day. Induction may take longer if:  · You are induced early in your pregnancy.  · It is your first pregnancy.  · Your cervix is not ready.  What are some risks associated with labor induction?  Some risks associated with labor induction include:  · Changes in fetal heart rate, such as being too   high, too low, or irregular (erratic).  · Failed induction.  · Infection in the mother or the baby.  · Increased risk of having a cesarean delivery.  · Fetal death.  · Breaking off (abruption) of the placenta from the uterus (rare).  · Rupture of the uterus (very rare).  When induction is needed for medical reasons, the benefits of induction generally outweigh the risks.  What are some reasons for not inducing labor?  Labor induction should not be done if:  · Your baby does not tolerate contractions.  · You have had previous surgeries on your uterus, such as a myomectomy, removal of fibroids, or a vertical scar from a previous cesarean delivery.  · Your placenta lies  very low in your uterus and blocks the opening of the cervix (placenta previa).  · Your baby is not in a head-down position.  · The umbilical cord drops down into the birth canal in front of the baby.  · There are unusual circumstances, such as the baby being very early (premature).  · You have had more than 2 previous cesarean deliveries.  Summary  · Labor induction is when steps are taken to cause a pregnant woman to begin the labor process.  · Labor induction causes a pregnant woman's uterus to contract. It also causes the cervix to ripen, dilate, and efface.  · Labor is not induced before 39 weeks of pregnancy unless there is a medical reason to do so.  · When induction is needed for medical reasons, the benefits of induction generally outweigh the risks.  This information is not intended to replace advice given to you by your health care provider. Make sure you discuss any questions you have with your health care provider.  Document Released: 11/13/2006 Document Revised: 08/07/2016 Document Reviewed: 08/07/2016  Elsevier Interactive Patient Education © 2019 Elsevier Inc.

## 2018-08-05 NOTE — Progress Notes (Signed)
   PRENATAL VISIT NOTE  Subjective:  Cindy Levine is a 23 y.o. G1P0 at [redacted]w[redacted]d who presents today for routine prenatal care.  She is currently being monitored for supervision of a low-risk pregnancy with problems as listed below.  Patient reports that her "iron level was 8.9 at [facility] last week," but she denies being on current supplementation. She endorses fetal movement and denies vaginal concerns including discharge, bleeding, leaking, itching, and burning. She also denies perception of contractions.  Patient Active Problem List   Diagnosis Date Noted  . Anemia affecting pregnancy in third trimester 08/05/2018  . Dermatitis 06/29/2018  . Encounter for supervision of normal pregnancy, antepartum 02/02/2018    The following portions of the patient's history were reviewed and updated as appropriate: allergies, current medications, past family history, past medical history, past social history, past surgical history and problem list. Problem list updated.  Objective:   Vitals:   08/05/18 1102  BP: 127/82  Pulse: 96  Weight: 189 lb 11.2 oz (86 kg)    Fetal Status: Fetal Heart Rate (bpm): NST Fundal Height: 39 cm Movement: Present  Presentation: Vertex  General:  Alert, oriented and cooperative. Patient is in no acute distress.  Skin: Skin is warm and dry.   Cardiovascular: Regular rate and rhythm.  Respiratory: Normal respiratory effort. CTA-Bilaterally  Abdomen: Soft, gravid, appropriate for gestational age.  Pelvic: Cervical exam performed Dilation: 3 Effacement (%): 80 Station: -3  Extremities: Normal range of motion.  Edema: Trace  Mental Status: Normal mood and affect. Normal behavior. Normal judgment and thought content.   Assessment and Plan:  Pregnancy: G1P0 at [redacted]w[redacted]d  1. Supervision of normal first pregnancy, antepartum -Induction of labor scheduled for 08/11/2018 -Offered and declined stripping of membranes today -Informed of favorable cervical findings; Discussed  induction to include pitocin and/or AROM -Reassurances given that if spontaneous labor occurs WB is still an option +Fetal nonstress test: Reactive  125 bpm, Mod Var, -Decels, +Accels  Toco: Irregular Ctx Q1-41min, palpates mild.   2. Anemia affecting pregnancy in third trimester -Informed of low HgB and Platelets when reviewing records. -Discussed need for blood work today -Discussed possible interventions to include iron transfusion, blood transfusion, or other as deemed medically appropriate. -Q/C addressed + CBC Pending   Term labor symptoms and general obstetric precautions including but not limited to vaginal bleeding, contractions, leaking of fluid and fetal movement were reviewed with the patient.  Please refer to After Visit Summary for other counseling recommendations.  Return for ROB.  Future Appointments  Date Time Provider Department Center  08/07/2018 10:30 AM Adam Phenix, MD [facility] None  08/11/2018 12:00 AM [facility] None    Cherre Robins, CNM 08/05/2018, 12:02 PM

## 2018-08-05 NOTE — Progress Notes (Signed)
ROB/TERM/ NST.

## 2018-08-06 LAB — CBC
HEMATOCRIT: 28 % — AB (ref 34.0–46.6)
Hemoglobin: 8.8 g/dL — ABNORMAL LOW (ref 11.1–15.9)
MCH: 23.5 pg — ABNORMAL LOW (ref 26.6–33.0)
MCHC: 31.4 g/dL — ABNORMAL LOW (ref 31.5–35.7)
MCV: 75 fL — ABNORMAL LOW (ref 79–97)
Platelets: 153 10*3/uL (ref 150–450)
RBC: 3.75 x10E6/uL — ABNORMAL LOW (ref 3.77–5.28)
RDW: 15.9 % — AB (ref 11.7–15.4)
WBC: 7.2 10*3/uL (ref 3.4–10.8)

## 2018-08-07 ENCOUNTER — Encounter: Payer: Self-pay | Admitting: Obstetrics & Gynecology

## 2018-08-07 ENCOUNTER — Telehealth (HOSPITAL_COMMUNITY): Payer: Self-pay | Admitting: *Deleted

## 2018-08-07 ENCOUNTER — Ambulatory Visit (INDEPENDENT_AMBULATORY_CARE_PROVIDER_SITE_OTHER): Payer: BLUE CROSS/BLUE SHIELD | Admitting: Obstetrics & Gynecology

## 2018-08-07 VITALS — BP 127/80 | HR 80 | Wt 187.0 lb

## 2018-08-07 DIAGNOSIS — Z3400 Encounter for supervision of normal first pregnancy, unspecified trimester: Secondary | ICD-10-CM

## 2018-08-07 DIAGNOSIS — Z3403 Encounter for supervision of normal first pregnancy, third trimester: Secondary | ICD-10-CM

## 2018-08-07 NOTE — Progress Notes (Signed)
   PRENATAL VISIT NOTE  Subjective:  Cindy Levine is a 23 y.o. G1P0 at [redacted]w[redacted]d being seen today for ongoing prenatal care.  She is currently monitored for the following issues for this low-risk pregnancy and has Encounter for supervision of normal pregnancy, antepartum; Dermatitis; and Anemia affecting pregnancy in third trimester on their problem list.  Patient reports possible leaking AF.  Contractions: Not present. Vag. Bleeding: None.  Movement: Present. Denies leaking of fluid.   The following portions of the patient's history were reviewed and updated as appropriate: allergies, current medications, past family history, past medical history, past social history, past surgical history and problem list. Problem list updated.  Objective:   Vitals:   08/07/18 1101  BP: 127/80  Pulse: 80  Weight: 187 lb (84.8 kg)    Fetal Status: Fetal Heart Rate (bpm): 140 Fundal Height: 39 cm Movement: Present     General:  Alert, oriented and cooperative. Patient is in no acute distress.  Skin: Skin is warm and dry. No rash noted.   Cardiovascular: Normal heart rate noted  Respiratory: Normal respiratory effort, no problems with respiration noted  Abdomen: Soft, gravid, appropriate for gestational age.  Pain/Pressure: Present     Pelvic: Cervical exam performed      Mucus discharge no pool negative nitrazine  Extremities: Normal range of motion.  Edema: Mild pitting, slight indentation  Mental Status: Normal mood and affect. Normal behavior. Normal judgment and thought content.   Assessment and Plan:  Pregnancy: G1P0 at [redacted]w[redacted]d  1. Supervision of normal first pregnancy, antepartum Mucus D/C no Rom  Term labor symptoms and general obstetric precautions including but not limited to vaginal bleeding, contractions, leaking of fluid and fetal movement were reviewed in detail with the patient. Please refer to After Visit Summary for other counseling recommendations.  Return in about 4 weeks (around  09/04/2018) for postpartum.  Future Appointments  Date Time Provider Department Center  08/11/2018 12:00 AM [facility] None    Scheryl Darter, MD

## 2018-08-07 NOTE — Progress Notes (Signed)
Pt presents for ROB states she thinks her water broke this morning. Fluid is leaking down her legs.

## 2018-08-07 NOTE — Patient Instructions (Signed)

## 2018-08-07 NOTE — Telephone Encounter (Signed)
Preadmission screen  

## 2018-08-11 ENCOUNTER — Inpatient Hospital Stay (HOSPITAL_COMMUNITY)
Admission: RE | Admit: 2018-08-11 | Discharge: 2018-08-14 | DRG: 788 | Disposition: A | Discharge status: Home / Self Care | Payer: Medicaid Other | Attending: Family Medicine | Admitting: Family Medicine

## 2018-08-11 ENCOUNTER — Inpatient Hospital Stay (HOSPITAL_COMMUNITY): Payer: Medicaid Other | Admitting: Anesthesiology

## 2018-08-11 ENCOUNTER — Encounter (HOSPITAL_COMMUNITY)
Admission: RE | Disposition: A | Discharge status: Home / Self Care | Payer: Self-pay | Source: Home / Self Care | Attending: Family Medicine

## 2018-08-11 ENCOUNTER — Other Ambulatory Visit: Payer: Self-pay

## 2018-08-11 ENCOUNTER — Encounter (HOSPITAL_COMMUNITY): Payer: Self-pay

## 2018-08-11 DIAGNOSIS — O9902 Anemia complicating childbirth: Secondary | ICD-10-CM | POA: Diagnosis present

## 2018-08-11 DIAGNOSIS — O480 Post-term pregnancy: Principal | ICD-10-CM | POA: Diagnosis present

## 2018-08-11 DIAGNOSIS — Z87891 Personal history of nicotine dependence: Secondary | ICD-10-CM | POA: Diagnosis not present

## 2018-08-11 DIAGNOSIS — Z3A41 41 weeks gestation of pregnancy: Secondary | ICD-10-CM

## 2018-08-11 DIAGNOSIS — D649 Anemia, unspecified: Secondary | ICD-10-CM | POA: Diagnosis present

## 2018-08-11 DIAGNOSIS — O99013 Anemia complicating pregnancy, third trimester: Secondary | ICD-10-CM | POA: Diagnosis present

## 2018-08-11 DIAGNOSIS — Z3490 Encounter for supervision of normal pregnancy, unspecified, unspecified trimester: Secondary | ICD-10-CM | POA: Diagnosis present

## 2018-08-11 HISTORY — DX: Other specified health status: Z78.9

## 2018-08-11 HISTORY — DX: Post-term pregnancy: O48.0

## 2018-08-11 LAB — CBC
HCT: 28.2 % — ABNORMAL LOW (ref 36.0–46.0)
Hemoglobin: 8.4 g/dL — ABNORMAL LOW (ref 12.0–15.0)
MCH: 23 pg — ABNORMAL LOW (ref 26.0–34.0)
MCHC: 29.8 g/dL — ABNORMAL LOW (ref 30.0–36.0)
MCV: 77 fL — ABNORMAL LOW (ref 80.0–100.0)
Platelets: 156 10*3/uL (ref 150–400)
RBC: 3.66 MIL/uL — ABNORMAL LOW (ref 3.87–5.11)
RDW: 17.1 % — ABNORMAL HIGH (ref 11.5–15.5)
WBC: 6.7 10*3/uL (ref 4.0–10.5)
nRBC: 0.9 % — ABNORMAL HIGH (ref 0.0–0.2)

## 2018-08-11 LAB — RPR: RPR Ser Ql: NONREACTIVE

## 2018-08-11 LAB — ABO/RH: ABO/RH(D): O POS

## 2018-08-11 SURGERY — Surgical Case
Anesthesia: Epidural

## 2018-08-11 MED ORDER — OXYTOCIN BOLUS FROM INFUSION: 500.0000 mL | Freq: Once | INTRAVENOUS | Status: DC

## 2018-08-11 MED ORDER — DIPHENHYDRAMINE HCL 50 MG/ML IJ SOLN: 12.5000 mg | INTRAMUSCULAR | Status: DC | PRN

## 2018-08-11 MED ORDER — ACETAMINOPHEN 325 MG PO TABS: 650.0000 mg | ORAL_TABLET | ORAL | Status: DC | PRN

## 2018-08-11 MED ORDER — LACTATED RINGERS AMNIOINFUSION
INTRAVENOUS | Status: DC
  Administered 2018-08-11: 18:00:00 via INTRAUTERINE

## 2018-08-11 MED ORDER — OXYCODONE-ACETAMINOPHEN 5-325 MG PO TABS: 1.0000 | ORAL_TABLET | ORAL | Status: DC | PRN

## 2018-08-11 MED ORDER — EPHEDRINE 5 MG/ML INJ: 10.0000 mg | INTRAVENOUS | Status: DC | PRN

## 2018-08-11 MED ORDER — LACTATED RINGERS IV SOLN
500.0000 mL | INTRAVENOUS | Status: DC | PRN
  Administered 2018-08-11: 500 mL via INTRAVENOUS

## 2018-08-11 MED ORDER — MISOPROSTOL 25 MCG QUARTER TABLET
25.0000 ug | ORAL_TABLET | ORAL | Status: DC | PRN
  Administered 2018-08-11 (×2): 25 ug via VAGINAL
  Filled 2018-08-11 (×2): qty 1

## 2018-08-11 MED ORDER — LIDOCAINE HCL (PF) 1 % IJ SOLN
INTRAMUSCULAR | Status: DC | PRN
  Administered 2018-08-11: 7 mL via EPIDURAL
  Administered 2018-08-11: 6 mL via EPIDURAL

## 2018-08-11 MED ORDER — OXYTOCIN 40 UNITS IN NORMAL SALINE INFUSION - SIMPLE MED: 2.5000 [IU]/h | INTRAVENOUS | Status: DC

## 2018-08-11 MED ORDER — OXYCODONE-ACETAMINOPHEN 5-325 MG PO TABS: 2.0000 | ORAL_TABLET | ORAL | Status: DC | PRN

## 2018-08-11 MED ORDER — OXYTOCIN 40 UNITS IN NORMAL SALINE INFUSION - SIMPLE MED
1.0000 m[IU]/min | INTRAVENOUS | Status: DC
  Administered 2018-08-11: 2 m[IU]/min via INTRAVENOUS
  Filled 2018-08-11: qty 1000

## 2018-08-11 MED ORDER — PHENYLEPHRINE 40 MCG/ML (10ML) SYRINGE FOR IV PUSH (FOR BLOOD PRESSURE SUPPORT): 80.0000 ug | PREFILLED_SYRINGE | INTRAVENOUS | Status: DC | PRN

## 2018-08-11 MED ORDER — ONDANSETRON HCL 4 MG/2ML IJ SOLN: 4.0000 mg | Freq: Four times a day (QID) | INTRAMUSCULAR | Status: DC | PRN

## 2018-08-11 MED ORDER — LIDOCAINE HCL (PF) 1 % IJ SOLN: 30.0000 mL | INTRAMUSCULAR | Status: DC | PRN

## 2018-08-11 MED ORDER — TERBUTALINE SULFATE 1 MG/ML IJ SOLN
0.2500 mg | Freq: Once | INTRAMUSCULAR | Status: AC | PRN
  Administered 2018-08-11: 0.25 mg via SUBCUTANEOUS
  Filled 2018-08-11: qty 1

## 2018-08-11 MED ORDER — OXYTOCIN 40 UNITS IN NORMAL SALINE INFUSION - SIMPLE MED
1.0000 m[IU]/min | INTRAVENOUS | Status: DC
  Administered 2018-08-11: 1 m[IU]/min via INTRAVENOUS

## 2018-08-11 MED ORDER — PHENYLEPHRINE 40 MCG/ML (10ML) SYRINGE FOR IV PUSH (FOR BLOOD PRESSURE SUPPORT)
80.0000 ug | PREFILLED_SYRINGE | INTRAVENOUS | Status: DC | PRN
  Filled 2018-08-11: qty 10

## 2018-08-11 MED ORDER — LACTATED RINGERS IV SOLN
INTRAVENOUS | Status: DC
  Administered 2018-08-11 (×2): via INTRAVENOUS

## 2018-08-11 MED ORDER — TERBUTALINE SULFATE 1 MG/ML IJ SOLN: 0.2500 mg | Freq: Once | INTRAMUSCULAR | Status: DC | PRN

## 2018-08-11 MED ORDER — FENTANYL 2.5 MCG/ML BUPIVACAINE 1/10 % EPIDURAL INFUSION (WH - ANES)
14.0000 mL/h | INTRAMUSCULAR | Status: DC | PRN
  Administered 2018-08-11: 14 mL/h via EPIDURAL
  Filled 2018-08-11: qty 100

## 2018-08-11 MED ORDER — LACTATED RINGERS IV SOLN: 500.0000 mL | Freq: Once | INTRAVENOUS | Status: DC

## 2018-08-11 MED ORDER — SOD CITRATE-CITRIC ACID 500-334 MG/5ML PO SOLN: 30.0000 mL | ORAL | Status: DC | PRN

## 2018-08-11 SURGICAL SUPPLY — 33 items
BENZOIN TINCTURE PRP APPL 2/3 (GAUZE/BANDAGES/DRESSINGS) ×2 IMPLANT
CHLORAPREP W/TINT 26ML (MISCELLANEOUS) ×2 IMPLANT
CLAMP CORD UMBIL (MISCELLANEOUS) IMPLANT
CLOSURE STERI STRIP 1/2 X4 (GAUZE/BANDAGES/DRESSINGS) ×2 IMPLANT
CLOTH BEACON ORANGE TIMEOUT ST (SAFETY) ×2 IMPLANT
DRSG OPSITE POSTOP 4X10 (GAUZE/BANDAGES/DRESSINGS) ×2 IMPLANT
ELECT REM PT RETURN 9FT ADLT (ELECTROSURGICAL) ×2
ELECTRODE REM PT RTRN 9FT ADLT (ELECTROSURGICAL) ×1 IMPLANT
EXTRACTOR VACUUM M CUP 4 TUBE (SUCTIONS) IMPLANT
GLOVE BIOGEL PI IND STRL 7.0 (GLOVE) ×2 IMPLANT
GLOVE BIOGEL PI IND STRL 7.5 (GLOVE) ×2 IMPLANT
GLOVE BIOGEL PI INDICATOR 7.0 (GLOVE) ×2
GLOVE BIOGEL PI INDICATOR 7.5 (GLOVE) ×2
GLOVE ECLIPSE 7.5 STRL STRAW (GLOVE) ×2 IMPLANT
GOWN STRL REUS W/TWL LRG LVL3 (GOWN DISPOSABLE) ×6 IMPLANT
KIT ABG SYR 3ML LUER SLIP (SYRINGE) IMPLANT
NEEDLE HYPO 25X5/8 SAFETYGLIDE (NEEDLE) ×2 IMPLANT
NS IRRIG 1000ML POUR BTL (IV SOLUTION) ×2 IMPLANT
PACK C SECTION WH (CUSTOM PROCEDURE TRAY) ×2 IMPLANT
PAD OB MATERNITY 4.3X12.25 (PERSONAL CARE ITEMS) ×2 IMPLANT
PENCIL SMOKE EVAC W/HOLSTER (ELECTROSURGICAL) ×2 IMPLANT
RTRCTR C-SECT PINK 25CM LRG (MISCELLANEOUS) ×2 IMPLANT
STRIP CLOSURE SKIN 1/2X4 (GAUZE/BANDAGES/DRESSINGS) ×2 IMPLANT
SUT PLAIN 2 0 (SUTURE) ×1
SUT PLAIN ABS 2-0 CT1 27XMFL (SUTURE) ×1 IMPLANT
SUT VIC AB 0 CTX 36 (SUTURE) ×3
SUT VIC AB 0 CTX36XBRD ANBCTRL (SUTURE) ×3 IMPLANT
SUT VIC AB 2-0 CT1 27 (SUTURE) ×1
SUT VIC AB 2-0 CT1 TAPERPNT 27 (SUTURE) ×1 IMPLANT
SUT VIC AB 4-0 KS 27 (SUTURE) ×2 IMPLANT
TOWEL OR 17X24 6PK STRL BLUE (TOWEL DISPOSABLE) ×2 IMPLANT
TRAY FOLEY W/BAG SLVR 14FR LF (SET/KITS/TRAYS/PACK) ×2 IMPLANT
WATER STERILE IRR 1000ML POUR (IV SOLUTION) ×2 IMPLANT

## 2018-08-11 NOTE — Discharge Summary (Signed)
Obstetrics Discharge Summary OB/GYN Faculty Practice   Patient Name: Cindy Levine DOB: 01/13/1996 MRN: [id number]  Date of admission: 08/11/2018 Delivering MD: Levie Heritage   Date of discharge: 08/14/2018  Admitting diagnosis: INDUCTION Intrauterine pregnancy: [redacted]w[redacted]d     Secondary diagnosis:   Principal Problem:   Encounter for planned induction of labor Active Problems:   Anemia affecting pregnancy in third trimester   Post-dates pregnancy    Discharge diagnosis: Term Pregnancy Delivered, Cesarean delivery   Postpartum procedures: 2U pRBCs Complications: blood transfusion for anemia down to 5.8   Outpatient Follow-Up: [ ]  continue to counsel on contraception choice [ ]  repeat CBC   Hospital course: Cindy Levine is a 23 y.o. [redacted]w[redacted]d who was admitted for PDIOL. She also presented with elevated blood pressures on admission, but her HELLP labs were within normal limits. She progressed to 6.5cm, but pitocin was not able to be continued because of recurrent decelerations. Therefore, she had a primary Cesarean delivery for fetal intolerance.  Her pregnancy was complicated by anemia. Delivery was uncomplicated. Please see delivery/op note for additional details. Her postpartum course was complicated by worsening anemia, as her HgB trended from 8.4>7.3>5.8. She received 2U pRBCs on POD#1, and she was asymptomatic. Her HgB on POD#2 or day of discharge was 7.9, and she was discharged home on iron. She was breastfeeding without difficulty. By day of discharge, she was passing flatus, urinating, eating and drinking without difficulty. Her pain was well-controlled, and she was discharged home with ibuprofen and oxycodone 5mg  #20. She will follow-up in clinic in 1 week for a BP and incision check and in 4-6 weeks for a postpartum visit.   Physical exam  Vitals:   08/13/18 1827 08/13/18 1858 08/13/18 2100 08/14/18 0515  BP: 127/72 (!) 122/59 114/84 112/70  Pulse: 85 88 81 88  Resp: 18 19 20 18    Temp: 99.2 F (37.3 C) 99 F (37.2 C) 99.2 F (37.3 C) 98.6 F (37 C)  TempSrc: Oral Oral Oral Oral  SpO2: 100% 100% 100% 99%  Weight:      Height:       General: well-appearing, NAD Lochia: appropriate Uterine Fundus: firm Incision: Dressing is dry, intact  some dried blood within marked areas DVT Evaluation: No significant calf/ankle edema. Labs: Lab Results  Component Value Date   WBC 9.2 08/13/2018   HGB 7.9 (L) 08/14/2018   HCT 25.3 (L) 08/14/2018   MCV 76.0 (L) 08/13/2018   PLT 126 (L) 08/13/2018   CMP Latest Ref Rng & Units 08/13/2018  Glucose 65 - 99 mg/dL -  BUN 6 - 20 mg/dL -  Creatinine 2.13 - 0.86 mg/dL 5.78  Sodium 469 - 629 mmol/L -  Potassium 3.5 - 5.1 mmol/L -  Chloride 101 - 111 mmol/L -  CO2 22 - 32 mmol/L -  Calcium 8.9 - 10.3 mg/dL -  Total Protein 6.0 - 8.3 g/dL -  Total Bilirubin 0.3 - 1.2 mg/dL -  Alkaline Phos 39 - 528 U/L -  AST 0 - 37 U/L -  ALT 0 - 35 U/L -    Discharge instructions: Per After Visit Summary and "Baby and Me Booklet"  After visit meds:  Allergies as of 08/14/2018      Reactions   Fish Allergy Shortness Of Breath, Swelling   Shellfish Allergy Shortness Of Breath, Swelling   Peanut-containing Drug Products Other (See Comments)   Allergy test said almonds       Medication List    TAKE  these medications   ferrous sulfate 325 (65 FE) MG tablet Take 1 tablet (325 mg total) by mouth every other day.   ibuprofen 800 MG tablet Commonly known as:  ADVIL,MOTRIN Take 1 tablet (800 mg total) by mouth 3 (three) times daily.   omeprazole 20 MG capsule Commonly known as:  PRILOSEC Take 1 capsule (20 mg total) by mouth 2 (two) times daily before a meal.   oxycodone 5 MG capsule Commonly known as:  OXY-IR Take 1 capsule (5 mg total) by mouth every 6 (six) hours as needed for up to 5 days for pain.   polyethylene glycol packet Commonly known as:  MIRALAX / GLYCOLAX Mix 1 packet or capful in any beverage 1-2 times to help  with regular bowel movements.   senna-docusate 8.6-50 MG tablet Commonly known as:  Senokot-S Take 2 tablets by mouth daily. Start taking on:  August 15, 2018   triamcinolone ointment 0.1 % Commonly known as:  KENALOG Apply topically to affected areas for up to 14 days of treatment.            Discharge Care Instructions  (From admission, onward)         Start     Ordered   08/14/18 0000  Discharge wound care:    Comments:  Okay to get dressing wet. Dressing can come off in 1 week. Okay to wait until incision check appointment for dressing to be removed.   08/14/18 0755         Postpartum contraception: undecided - had considered patch but also wants to exclusively breastfeed Diet: Routine Diet Activity: Advance as tolerated. Pelvic rest for 6 weeks.   Follow-up Appt: Future Appointments  Date Time Provider Department Center  08/26/2018  1:30 PM Sharyon Cableogers, Veronica C, CNM [facility] None  09/09/2018  9:30 AM Constant, Gigi GinPeggy, MD [facility] None   Follow-up Visit:No follow-ups on file.  Newborn Data: Live born female  Birth Weight: 7 lb 5.6 oz (3335 g) APGAR: 6, 8  Newborn Delivery   Birth date/time:  08/12/2018 00:25:00 Delivery type:  C-Section, Low Transverse Trial of labor:  Yes C-section categorization:  Primary    Baby Feeding: Breast Disposition:home with mother  Cristal DeerLaurel S. Earlene PlaterWallace, DO OB/GYN Fellow, Faculty Practice

## 2018-08-11 NOTE — Anesthesia Pain Management Evaluation Note (Signed)
  CRNA Pain Management Visit Note  Patient: Cindy Levine, 23 y.o., female  "Hello I am a member of the anesthesia team at [facility]. We have an anesthesia team available at all times to provide care throughout the hospital, including epidural management and anesthesia for C-section. I don't know your plan for the delivery whether it a natural birth, water birth, IV sedation, nitrous supplementation, doula or epidural, but we want to meet your pain goals."   1.Was your pain managed to your expectations on prior hospitalizations?   No prior hospitalizations  2.What is your expectation for pain management during this hospitalization?     Epidural  3.How can we help you reach that goal? Epidural when ready  Record the patient's initial score and the patient's pain goal.   Pain: 0  Pain Goal: 6 [facility] wants you to be able to say your pain was always managed very well.  Edison Pace 08/11/2018

## 2018-08-11 NOTE — Progress Notes (Signed)
Patient ID: Cindy Levine, female   DOB: [birth year], 23 y.o.   MRN: [id number] Comfortable with epidural  Intermittent variable decels, positional Average variability Good accels  UCs 150MVU/10"  Dilation: 6.5 Effacement (%): 70, 80 Cervical Position: Anterior Station: -2, -1 Presentation: Vertex Exam by:: Aloise Copus    A: Hypotonic uterine dysfunction  P: Will start Pitocin augmentation  Turned to side with peanut

## 2018-08-11 NOTE — Progress Notes (Signed)
The patient received her epidural at 1628. Her fetal heart rate tracings began showing prolonged decelerations with FHR to the 60s. The first decel occurred from 1635 to 1641. The patient was placed on oxygen and repositioned laterally. The heart rate returned to baseline. Another prolonged deceleration occurred again from 1648 to 1654. The patient was repositioned, this time to all fours while she remained on oxygen. At this time terbutaline 0.25mg  was also administered IM and the heart rate returned to baseline. The patient has remained stable with baseline fetal HR in the 130s and 140s. The patient will continue to be closely monitored.    Peggyann ShoalsHannah Anderson, DO [facility], PGY-1 08/11/2018 5:19 PM

## 2018-08-11 NOTE — Anesthesia Preprocedure Evaluation (Addendum)
Anesthesia Evaluation  Patient identified by MRN, date of birth, ID band Patient awake    Reviewed: Allergy & Precautions, H&P , NPO status , Patient's Chart, lab work & pertinent test results  Airway Mallampati: II  TM Distance: >3 FB Neck ROM: full    Dental no notable dental hx.    Pulmonary neg pulmonary ROS, former smoker,    Pulmonary exam normal breath sounds clear to auscultation       Cardiovascular negative cardio ROS   Rhythm:regular Rate:Normal     Neuro/Psych negative neurological ROS  negative psych ROS   GI/Hepatic negative GI ROS, Neg liver ROS,   Endo/Other  negative endocrine ROS  Renal/GU negative Renal ROS  negative genitourinary   Musculoskeletal   Abdominal Normal abdominal exam  (+)   Peds  Hematology  (+) Blood dyscrasia, anemia ,   Anesthesia Other Findings   Reproductive/Obstetrics (+) Pregnancy                             Anesthesia Physical Anesthesia Plan  ASA: II  Anesthesia Plan: Epidural   Post-op Pain Management:    Induction:   PONV Risk Score and Plan:   Airway Management Planned:   Additional Equipment:   Intra-op Plan:   Post-operative Plan:   Informed Consent: I have reviewed the patients History and Physical, chart, labs and discussed the procedure including the risks, benefits and alternatives for the proposed anesthesia with the patient or authorized representative who has indicated his/her understanding and acceptance.       Plan Discussed with:   Anesthesia Plan Comments:         Anesthesia Quick Evaluation

## 2018-08-11 NOTE — Progress Notes (Cosign Needed)
LABOR PROGRESS NOTE  Cindy Levine is a 23 y.o. G1P0 at 3519w0d admitted for IOL for PD.  Subjective: Patient is seen resting and relaxing in bed asleep. Family are at bedside.   Objective: BP (!) 110/47   Pulse 90   Temp 98.7 F (37.1 C) (Oral)   Resp 18   Ht 5\' 8"  (1.727 m)   Wt 85.8 kg   LMP 10/28/2017   SpO2 100%   BMI 28.77 kg/m  or  Vitals:   08/11/18 1731 08/11/18 1801 08/11/18 1831 08/11/18 1901  BP: (!) 110/55 (!) 118/58 (!) 107/58 (!) 110/47  Pulse: (!) 101 97 (!) 104 90  Resp: 18 18 18 18   Temp: 98.7 F (37.1 C)     TempSrc: Oral     SpO2:      Weight:      Height:       Dilation: 7 Effacement (%): 80 Station: -1 Presentation: Vertex Exam by:: Johnson Controlsnderson FHT: baseline rate 130, minimal varibility, no accels, no decels Toco: Contractions q6 minutes, IUPC in place showing ~60 MVUs per contraction  Labs: Lab Results  Component Value Date   WBC 6.7 08/11/2018   HGB 8.4 (L) 08/11/2018   HCT 28.2 (L) 08/11/2018   MCV 77.0 (L) 08/11/2018   PLT 156 08/11/2018    Patient Active Problem List   Diagnosis Date Noted  . Encounter for planned induction of labor 08/11/2018  . Post-dates pregnancy 08/11/2018  . Anemia affecting pregnancy in third trimester 08/05/2018  . Dermatitis 06/29/2018  . Encounter for supervision of normal pregnancy, antepartum 02/02/2018    Assessment / Plan: 23 y.o. G1P0 at 7519w0d here for IOL for PD.  Labor: Active, patient is steadily progressing, she is s/p Cytotec  Fetal Wellbeing: Category 2 Pain Control:  Epidural Anticipated MOD:  Vaginal  Peggyann ShoalsHannah Anderson, DO [facility], PGY-1 08/11/2018 7:17 PM

## 2018-08-11 NOTE — Progress Notes (Signed)
LABOR PROGRESS NOTE  Cindy Levine is a 23 y.o. G1P0 at [redacted]w[redacted]d admitted for IOL for PD.  Subjective: Patient is seen in the bathroom, she states she is feeling uncomfortable and having a lot of contractions. She denies bleeding or fluid leakage. She is occasionally experiencing fetal movement. Baby's strip has long periods of minimal to no variability.  Objective: BP 116/75   Pulse 94   Resp 18   Ht 5\' 8"  (1.727 m)   Wt 85.8 kg   LMP 10/28/2017   BMI 28.77 kg/m  or  Vitals:   08/11/18 0718 08/11/18 0737 08/11/18 1020  BP: (!) 128/92  116/75  Pulse: (!) 113  94  Resp: 18  18  Weight:  85.8 kg   Height:  5\' 8"  (1.727 m)    Dilation: 3 Effacement (%): 50 Station: Ballotable Presentation: Vertex Exam by:: Foye Clock RN FHT: baseline rate 130, minimal varibility, no accel, variable to no decel Toco: contractions q4-5  Labs: Lab Results  Component Value Date   WBC 6.7 08/11/2018   HGB 8.4 (L) 08/11/2018   HCT 28.2 (L) 08/11/2018   MCV 77.0 (L) 08/11/2018   PLT 156 08/11/2018    Patient Active Problem List   Diagnosis Date Noted  . Encounter for planned induction of labor 08/11/2018  . Post-dates pregnancy 08/11/2018  . Anemia affecting pregnancy in third trimester 08/05/2018  . Dermatitis 06/29/2018  . Encounter for supervision of normal pregnancy, antepartum 02/02/2018    [name] here for IOL for PD.  Labor: Active, Cytotec x 2 Fetal Wellbeing:  Category 2 Pain Control:  Planning for epidural Anticipated MOD:  Vaginal  Peggyann Shoals, DO [facility], PGY-1 08/11/2018 2:27 PM

## 2018-08-11 NOTE — Progress Notes (Signed)
Makensey Ogan is a 23 y.o. G1P0 at [redacted]w[redacted]d  Subjective: Patient s/p epidural. Very mobile. Denies pain  Objective: BP 133/66   Pulse (!) 131   Resp 18   Ht 5\' 8"  (1.727 m)   Wt 85.8 kg   LMP 10/28/2017   SpO2 100%   BMI 28.77 kg/m  No intake/output data recorded. No intake/output data recorded.  FHT:  FHR: 130 bpm, variability: moderate,  accelerations:  Present,  decelerations:  Present prolonged UC:   irregular, every 3-4 minutes SVE:   Dilation: 6.5 Effacement (%): 80 Station: -1 Exam by:: Trek Kimball,CNM  Labs: Lab Results  Component Value Date   WBC 6.7 08/11/2018   HGB 8.4 (L) 08/11/2018   HCT 28.2 (L) 08/11/2018   MCV 77.0 (L) 08/11/2018   PLT 156 08/11/2018    Assessment / Plan: At bedside for evaluation of prolonged decel Good variability throughout Slow return to baseline with patient in hands and knees position FSE placed Large amount of particulate meconium expelled during patient maneuvers   Calvert Cantor 08/11/2018, 4:36 PM

## 2018-08-11 NOTE — Anesthesia Procedure Notes (Signed)
Epidural Patient location during procedure: OB Start time: 08/11/2018 4:20 PM End time: 08/11/2018 4:24 PM  Staffing Anesthesiologist: Leilani Able, MD Performed: anesthesiologist   Preanesthetic Checklist Completed: patient identified, site marked, surgical consent, pre-op evaluation, timeout performed, IV checked, risks and benefits discussed and monitors and equipment checked  Epidural Patient position: sitting Prep: site prepped and draped and DuraPrep Patient monitoring: continuous pulse ox and blood pressure Approach: midline Location: L3-L4 Injection technique: LOR air  Needle:  Needle type: Tuohy  Needle gauge: 17 G Needle length: 9 cm and 9 Needle insertion depth: 6 cm Catheter type: closed end flexible Catheter size: 19 Gauge Catheter at skin depth: 11 cm Test dose: negative and Other  Assessment Sensory level: T9 Events: blood not aspirated, injection not painful, no injection resistance, negative IV test and no paresthesia  Additional Notes Reason for block:procedure for pain

## 2018-08-11 NOTE — H&P (Signed)
LABOR AND DELIVERY ADMISSION HISTORY AND PHYSICAL NOTE  Cindy Levine is a 23 y.o. female G1P0 with IUP at [redacted]w[redacted]d by LMP presenting for IOL for postdates.  She reports positive fetal movement. She denies leakage of fluid or vaginal bleeding.  Prenatal History/Complications: PNC at [facility] established at 13 weeks.   Pregnancy complications:  - anemia   Past Medical History: Past Medical History:  Diagnosis Date  . Medical history non-contributory     Past Surgical History: Past Surgical History:  Procedure Laterality Date  . NO PAST SURGERIES      Obstetrical History: OB History    Gravida  1   Para      Term      Preterm      AB      Living        SAB      TAB      Ectopic      Multiple      Live Births              Social History: Social History   Socioeconomic History  . Marital status: Single    Spouse name: Not on file  . Number of children: Not on file  . Years of education: Not on file  . Highest education level: Not on file  Occupational History  . Not on file  Social Needs  . Financial resource strain: Not on file  . Food insecurity:    Worry: Not on file    Inability: Not on file  . Transportation needs:    Medical: Not on file    Non-medical: Not on file  Tobacco Use  . Smoking status: Former Smoker    Types: Cigarettes    Last attempt to quit: 2019    Years since quitting: 1.0  . Smokeless tobacco: Never Used  Substance and Sexual Activity  . Alcohol use: No  . Drug use: No  . Sexual activity: Yes    Birth control/protection: None  Lifestyle  . Physical activity:    Days per week: Not on file    Minutes per session: Not on file  . Stress: Not on file  Relationships  . Social connections:    Talks on phone: Not on file    Gets together: Not on file    Attends religious service: Not on file    Active member of club or organization: Not on file    Attends meetings of clubs or organizations: Not on file   Relationship status: Not on file  Other Topics Concern  . Not on file  Social History Narrative  . Not on file    Family History: Family History  Problem Relation Age of Onset  . Diabetes Father   . Prostate cancer Maternal Grandfather   . Hypertension Maternal Grandfather   . Hypertension Paternal Grandmother   . Diabetes Paternal Grandmother     Allergies: Allergies  Allergen Reactions  . Fish Allergy Shortness Of Breath and Swelling  . Shellfish Allergy Shortness Of Breath and Swelling  . Peanut-Containing Drug Products Other (See Comments)    Allergy test said almonds     Medications Prior to Admission  Medication Sig Dispense Refill Last Dose  . omeprazole (PRILOSEC) 20 MG capsule Take 1 capsule (20 mg total) by mouth 2 (two) times daily before a meal. 60 capsule 5 08/10/2018 at Unknown time  . triamcinolone ointment (KENALOG) 0.1 % Apply topically to affected areas for up to 14 days of  treatment. 60 g 2 08/10/2018 at Unknown time     Review of Systems  All systems reviewed and negative except as stated in HPI  Physical Exam Blood pressure (!) 128/92, pulse (!) 113, resp. rate 18, height 5\' 8"  (1.727 m), weight 85.8 kg, last menstrual period 10/28/2017. General appearance: alert, oriented, NAD Lungs: normal respiratory effort Heart: regular rate Abdomen: soft, non-tender; gravid, FH appropriate for GA Extremities: No calf swelling or tenderness Presentation: cephalic Fetal monitoring: 130 bpm, moderate variability, +acels, no decels  Uterine activity: q3-6 min  Dilation: 3 Effacement (%): 50 Station: -3 Exam by:: Foye Clock RN   Prenatal labs: ABO, Rh: O/Positive/-- (07/29 1206) Antibody: Negative (07/29 1206) Rubella: 3.63 (07/29 1206) RPR: Non Reactive (10/21 1019)  HBsAg: Negative (07/29 1206)  HIV: Non Reactive (10/21 1019)  GC/Chlamydia: Negative  GBS: Negative (01/03 1109)  2-hr GTT: Normal  Genetic screening:  Normal  Anatomy US: normal with  echogenic intracardiac focus   Prenatal Transfer Tool  Maternal Diabetes: No Genetic Screening: Normal Maternal Ultrasounds/Referrals: Normal Fetal Ultrasounds or other Referrals:  None Maternal Substance Abuse:  No Significant Maternal Medications:  None Significant Maternal Lab Results: None  Results for orders placed or performed during the hospital encounter of 08/11/18 (from the past 24 hour(s))  CBC   Collection Time: 08/11/18  7:35 AM  Result Value Ref Range   WBC 6.7 4.0 - 10.5 K/uL   RBC 3.66 (L) 3.87 - 5.11 MIL/uL   Hemoglobin 8.4 (L) 12.0 - 15.0 g/dL   HCT 04.5 (L) 99.7 - 74.1 %   MCV 77.0 (L) 80.0 - 100.0 fL   MCH 23.0 (L) 26.0 - 34.0 pg   MCHC 29.8 (L) 30.0 - 36.0 g/dL   RDW 42.3 (H) 95.3 - 20.2 %   Platelets 156 150 - 400 K/uL   nRBC 0.9 (H) 0.0 - 0.2 %    Patient Active Problem List   Diagnosis Date Noted  . Encounter for planned induction of labor 08/11/2018  . Post-dates pregnancy 08/11/2018  . Anemia affecting pregnancy in third trimester 08/05/2018  . Dermatitis 06/29/2018  . Encounter for supervision of normal pregnancy, antepartum 02/02/2018    Assessment: Cindy Levine is a 23 y.o. G1P0 at [redacted]w[redacted]d here for IOL for PD.   #Labor: Induction. Start with cytotec due to thickness of cervix. Plan to proceed with Pitocin if cervix more favorable on next exam.  #Pain: Undecided about epidural  #FWB: Cat I  #ID:  GBS neg  #MOF: Breast #MOC: Patch  #Circ:  Outpatient   De Hollingshead 08/11/2018, 9:31 AM

## 2018-08-11 NOTE — Progress Notes (Signed)
Patient ID: Adolphus Birchwood, female   DOB: 1996-06-26, 23 y.o.   MRN: [id number] Vitals:   08/11/18 2031 08/11/18 2131 08/11/18 2153 08/11/18 2201  BP: 132/74 121/79  126/77  Pulse: 91 (!) 237  96  Resp: 18 17  19   Temp:   99.2 F (37.3 C)   TempSrc:   Axillary   SpO2:      Weight:      Height:       FHR had a series of late decels followed by deeper variable decels Amnioinfusion infusing Pitocin stopped Pt in knee/chest FHR resolved  Dilation: 6.5 Effacement (%): 70, 80 Cervical Position: Anterior Station: -2, -1 Presentation: Vertex Exam by:: Porfiria Heinrich   Consulted Dr Adrian Blackwater Rest baby 20-3- min then try restarting Pitocin

## 2018-08-12 ENCOUNTER — Encounter (HOSPITAL_COMMUNITY): Payer: Self-pay

## 2018-08-12 LAB — CBC
HCT: 23.3 % — ABNORMAL LOW (ref 36.0–46.0)
Hemoglobin: 7.3 g/dL — ABNORMAL LOW (ref 12.0–15.0)
MCH: 23.7 pg — ABNORMAL LOW (ref 26.0–34.0)
MCHC: 31.3 g/dL (ref 30.0–36.0)
MCV: 75.6 fL — ABNORMAL LOW (ref 80.0–100.0)
Platelets: 139 10*3/uL — ABNORMAL LOW (ref 150–400)
RBC: 3.08 MIL/uL — ABNORMAL LOW (ref 3.87–5.11)
RDW: 17.2 % — ABNORMAL HIGH (ref 11.5–15.5)
WBC: 13 10*3/uL — ABNORMAL HIGH (ref 4.0–10.5)
nRBC: 0.4 % — ABNORMAL HIGH (ref 0.0–0.2)

## 2018-08-12 MED ORDER — MENTHOL 3 MG MT LOZG: 1.0000 | LOZENGE | OROMUCOSAL | Status: DC | PRN

## 2018-08-12 MED ORDER — MEPERIDINE HCL 25 MG/ML IJ SOLN: 6.2500 mg | INTRAMUSCULAR | Status: DC | PRN

## 2018-08-12 MED ORDER — LIDOCAINE-EPINEPHRINE (PF) 2 %-1:200000 IJ SOLN
INTRAMUSCULAR | Status: AC
  Filled 2018-08-12: qty 20

## 2018-08-12 MED ORDER — SODIUM CHLORIDE 0.9 % IR SOLN
Status: DC | PRN
  Administered 2018-08-12: 1

## 2018-08-12 MED ORDER — MORPHINE SULFATE (PF) 0.5 MG/ML IJ SOLN
INTRAMUSCULAR | Status: DC | PRN
  Administered 2018-08-12: 3 mg via EPIDURAL

## 2018-08-12 MED ORDER — SIMETHICONE 80 MG PO CHEW
80.0000 mg | CHEWABLE_TABLET | ORAL | Status: DC
  Administered 2018-08-12 – 2018-08-13 (×2): 80 mg via ORAL
  Filled 2018-08-12 (×2): qty 1

## 2018-08-12 MED ORDER — LACTATED RINGERS IV SOLN
INTRAVENOUS | Status: DC | PRN
  Administered 2018-08-12: via INTRAVENOUS

## 2018-08-12 MED ORDER — ONDANSETRON HCL 4 MG/2ML IJ SOLN: 4.0000 mg | Freq: Once | INTRAMUSCULAR | Status: DC | PRN

## 2018-08-12 MED ORDER — NALBUPHINE HCL 10 MG/ML IJ SOLN: 5.0000 mg | Freq: Once | INTRAMUSCULAR | Status: DC | PRN

## 2018-08-12 MED ORDER — SODIUM CHLORIDE 0.9% FLUSH: 3.0000 mL | INTRAVENOUS | Status: DC | PRN

## 2018-08-12 MED ORDER — SIMETHICONE 80 MG PO CHEW: 80.0000 mg | CHEWABLE_TABLET | ORAL | Status: DC | PRN

## 2018-08-12 MED ORDER — LIDOCAINE-EPINEPHRINE 2 %-1:100000 IJ SOLN
INTRAMUSCULAR | Status: DC | PRN
  Administered 2018-08-12: 6 mL via INTRADERMAL
  Administered 2018-08-12: 4 mL via INTRADERMAL

## 2018-08-12 MED ORDER — ENOXAPARIN SODIUM 40 MG/0.4ML ~~LOC~~ SOLN
40.0000 mg | SUBCUTANEOUS | Status: DC
  Administered 2018-08-12 – 2018-08-14 (×3): 40 mg via SUBCUTANEOUS
  Filled 2018-08-12 (×3): qty 0.4

## 2018-08-12 MED ORDER — DIPHENHYDRAMINE HCL 50 MG/ML IJ SOLN: 12.5000 mg | INTRAMUSCULAR | Status: DC | PRN

## 2018-08-12 MED ORDER — HYDROCODONE-ACETAMINOPHEN 7.5-325 MG PO TABS: 1.0000 | ORAL_TABLET | Freq: Once | ORAL | Status: DC | PRN

## 2018-08-12 MED ORDER — KETOROLAC TROMETHAMINE 30 MG/ML IJ SOLN
30.0000 mg | Freq: Four times a day (QID) | INTRAMUSCULAR | Status: DC | PRN
  Administered 2018-08-12: 30 mg via INTRAMUSCULAR

## 2018-08-12 MED ORDER — NALOXONE HCL 4 MG/10ML IJ SOLN
1.0000 ug/kg/h | INTRAVENOUS | Status: DC | PRN
  Filled 2018-08-12: qty 5

## 2018-08-12 MED ORDER — SODIUM CHLORIDE 0.9 % IV SOLN
500.0000 mg | Freq: Once | INTRAVENOUS | Status: AC
  Administered 2018-08-12: 500 mg via INTRAVENOUS
  Filled 2018-08-12: qty 500

## 2018-08-12 MED ORDER — PHENYLEPHRINE HCL 10 MG/ML IJ SOLN
INTRAMUSCULAR | Status: DC | PRN
  Administered 2018-08-12: 80 ug via INTRAVENOUS

## 2018-08-12 MED ORDER — NALOXONE HCL 0.4 MG/ML IJ SOLN: 0.4000 mg | INTRAMUSCULAR | Status: DC | PRN

## 2018-08-12 MED ORDER — CEFAZOLIN SODIUM-DEXTROSE 2-4 GM/100ML-% IV SOLN: 2.0000 g | Freq: Once | INTRAVENOUS | Status: DC

## 2018-08-12 MED ORDER — KETOROLAC TROMETHAMINE 30 MG/ML IJ SOLN
INTRAMUSCULAR | Status: AC
  Filled 2018-08-12: qty 1

## 2018-08-12 MED ORDER — NALBUPHINE HCL 10 MG/ML IJ SOLN: 5.0000 mg | INTRAMUSCULAR | Status: DC | PRN

## 2018-08-12 MED ORDER — SENNOSIDES-DOCUSATE SODIUM 8.6-50 MG PO TABS
2.0000 | ORAL_TABLET | ORAL | Status: DC
  Administered 2018-08-12 – 2018-08-13 (×2): 2 via ORAL
  Filled 2018-08-12 (×2): qty 2

## 2018-08-12 MED ORDER — ACETAMINOPHEN 500 MG PO TABS
1000.0000 mg | ORAL_TABLET | Freq: Four times a day (QID) | ORAL | Status: DC
  Administered 2018-08-12 (×3): 1000 mg via ORAL
  Filled 2018-08-12 (×3): qty 2

## 2018-08-12 MED ORDER — KETOROLAC TROMETHAMINE 30 MG/ML IJ SOLN: 30.0000 mg | Freq: Four times a day (QID) | INTRAMUSCULAR | Status: DC | PRN

## 2018-08-12 MED ORDER — WITCH HAZEL-GLYCERIN EX PADS: 1.0000 "application " | MEDICATED_PAD | CUTANEOUS | Status: DC | PRN

## 2018-08-12 MED ORDER — DEXAMETHASONE SODIUM PHOSPHATE 4 MG/ML IJ SOLN
INTRAMUSCULAR | Status: DC | PRN
  Administered 2018-08-12: 4 mg via INTRAVENOUS

## 2018-08-12 MED ORDER — OXYTOCIN 40 UNITS IN NORMAL SALINE INFUSION - SIMPLE MED: 2.5000 [IU]/h | INTRAVENOUS | Status: AC

## 2018-08-12 MED ORDER — OXYCODONE-ACETAMINOPHEN 5-325 MG PO TABS: 1.0000 | ORAL_TABLET | ORAL | Status: DC | PRN

## 2018-08-12 MED ORDER — ONDANSETRON HCL 4 MG/2ML IJ SOLN
INTRAMUSCULAR | Status: DC | PRN
  Administered 2018-08-12: 4 mg via INTRAVENOUS

## 2018-08-12 MED ORDER — HYDROMORPHONE HCL 1 MG/ML IJ SOLN: 0.2500 mg | INTRAMUSCULAR | Status: DC | PRN

## 2018-08-12 MED ORDER — ONDANSETRON HCL 4 MG/2ML IJ SOLN
INTRAMUSCULAR | Status: AC
  Filled 2018-08-12: qty 4

## 2018-08-12 MED ORDER — DIBUCAINE 1 % RE OINT: 1.0000 "application " | TOPICAL_OINTMENT | RECTAL | Status: DC | PRN

## 2018-08-12 MED ORDER — SIMETHICONE 80 MG PO CHEW
80.0000 mg | CHEWABLE_TABLET | Freq: Three times a day (TID) | ORAL | Status: DC
  Administered 2018-08-12 – 2018-08-14 (×8): 80 mg via ORAL
  Filled 2018-08-12 (×9): qty 1

## 2018-08-12 MED ORDER — OXYTOCIN 10 UNIT/ML IJ SOLN
INTRAVENOUS | Status: DC | PRN
  Administered 2018-08-12: 40 [IU] via INTRAVENOUS

## 2018-08-12 MED ORDER — OXYTOCIN 10 UNIT/ML IJ SOLN
INTRAMUSCULAR | Status: AC
  Filled 2018-08-12: qty 4

## 2018-08-12 MED ORDER — MORPHINE SULFATE (PF) 0.5 MG/ML IJ SOLN
INTRAMUSCULAR | Status: AC
  Filled 2018-08-12: qty 10

## 2018-08-12 MED ORDER — ZOLPIDEM TARTRATE 5 MG PO TABS: 5.0000 mg | ORAL_TABLET | Freq: Every evening | ORAL | Status: DC | PRN

## 2018-08-12 MED ORDER — DIPHENHYDRAMINE HCL 25 MG PO CAPS
25.0000 mg | ORAL_CAPSULE | ORAL | Status: DC | PRN
  Filled 2018-08-12: qty 1

## 2018-08-12 MED ORDER — SCOPOLAMINE 1 MG/3DAYS TD PT72: 1.0000 | MEDICATED_PATCH | Freq: Once | TRANSDERMAL | Status: DC

## 2018-08-12 MED ORDER — COCONUT OIL OIL: 1.0000 "application " | TOPICAL_OIL | Status: DC | PRN

## 2018-08-12 MED ORDER — DIPHENHYDRAMINE HCL 25 MG PO CAPS: 25.0000 mg | ORAL_CAPSULE | Freq: Four times a day (QID) | ORAL | Status: DC | PRN

## 2018-08-12 MED ORDER — KETOROLAC TROMETHAMINE 30 MG/ML IJ SOLN
30.0000 mg | Freq: Four times a day (QID) | INTRAMUSCULAR | Status: DC
  Administered 2018-08-12 (×2): 30 mg via INTRAVENOUS
  Filled 2018-08-12 (×2): qty 1

## 2018-08-12 MED ORDER — ONDANSETRON HCL 4 MG/2ML IJ SOLN: 4.0000 mg | Freq: Three times a day (TID) | INTRAMUSCULAR | Status: DC | PRN

## 2018-08-12 MED ORDER — CEFAZOLIN SODIUM-DEXTROSE 2-3 GM-%(50ML) IV SOLR
INTRAVENOUS | Status: DC | PRN
  Administered 2018-08-12: 2 g via INTRAVENOUS

## 2018-08-12 MED ORDER — CEFAZOLIN SODIUM-DEXTROSE 2-4 GM/100ML-% IV SOLN
INTRAVENOUS | Status: AC
  Filled 2018-08-12: qty 100

## 2018-08-12 MED ORDER — DEXAMETHASONE SODIUM PHOSPHATE 4 MG/ML IJ SOLN
INTRAMUSCULAR | Status: AC
  Filled 2018-08-12: qty 1

## 2018-08-12 MED ORDER — TETANUS-DIPHTH-ACELL PERTUSSIS 5-2.5-18.5 LF-MCG/0.5 IM SUSP: 0.5000 mL | Freq: Once | INTRAMUSCULAR | Status: DC

## 2018-08-12 MED ORDER — ACETAMINOPHEN 160 MG/5ML PO SOLN
1000.0000 mg | Freq: Four times a day (QID) | ORAL | Status: DC
  Administered 2018-08-12: 1000 mg via ORAL
  Filled 2018-08-12: qty 40.6

## 2018-08-12 MED ORDER — LACTATED RINGERS IV SOLN
INTRAVENOUS | Status: DC
  Administered 2018-08-12: 11:00:00 via INTRAVENOUS

## 2018-08-12 MED ORDER — ACETAMINOPHEN 10 MG/ML IV SOLN: 1000.0000 mg | Freq: Once | INTRAVENOUS | Status: DC | PRN

## 2018-08-12 NOTE — Op Note (Addendum)
Cindy Levine PROCEDURE DATE: 08/11/2018 - 08/12/2018  PREOPERATIVE DIAGNOSIS: Intrauterine pregnancy at  5060w1d weeks gestation; failure to progress: arrest of dilation and non-reassuring fetal status  POSTOPERATIVE DIAGNOSIS: The same  PROCEDURE: Primary Low Transverse Cesarean Section  SURGEON:  Dr. Candelaria CelesteJacob Stinson  ASSISTANT: Dr Daneen SchickNimeka Philip  INDICATIONS: Cindy Levine is a 23 y.o. G1P0 at 1060w1d scheduled for cesarean section secondary to failure to progress: arrest of dilation and non-reassuring fetal status.  The risks of cesarean section discussed with the patient included but were not limited to: bleeding which may require transfusion or reoperation; infection which may require antibiotics; injury to bowel, bladder, ureters or other surrounding organs; injury to the fetus; need for additional procedures including hysterectomy in the event of a life-threatening hemorrhage; placental abnormalities wth subsequent pregnancies, incisional problems, thromboembolic phenomenon and other postoperative/anesthesia complications. The patient concurred with the proposed plan, giving informed written consent for the procedure.    FINDINGS:  Viable female infant in cephalic presentation.  Apgars 6 and 8, weight, 7 pounds and 5.6 ounces.  Clear amniotic fluid.  Intact placenta, three vessel cord.  Normal uterus, fallopian tubes and ovaries bilaterally.  ANESTHESIA:    Spinal INTRAVENOUS FLUIDS:800 ml ESTIMATED BLOOD LOSS: 231 ml URINE OUTPUT:  100 ml SPECIMENS: Placenta sent to pathology COMPLICATIONS: None immediate  PROCEDURE IN DETAIL:  The patient received intravenous antibiotics and had sequential compression devices applied to her lower extremities while in the preoperative area.  She was then taken to the operating room where spinal anesthesia was administered (epidural anesthesia was dosed up to surgical level) and was found to be adequate. She was then placed in a dorsal supine position with a  leftward tilt, and prepped and draped in a sterile manner.  A foley catheter was placed into her bladder and attached to constant gravity, which drained clear fluid throughout.  After an adequate timeout was performed, a Pfannenstiel skin incision was made with scalpel and carried through to the underlying layer of fascia. The fascia was incised in the midline and this incision was extended bilaterally using the Mayo scissors. Kocher clamps were applied to the superior aspect of the fascial incision and the underlying rectus muscles were dissected off bluntly. A similar process was carried out on the inferior aspect of the facial incision. The rectus muscles were separated in the midline bluntly and the peritoneum was entered bluntly. An Alexis retractor was placed to aid in visualization of the uterus.  Attention was turned to the lower uterine segment where a transverse hysterotomy was made with a scalpel and extended bilaterally bluntly. The infant was successfully delivered, and cord was clamped and cut and infant was handed over to awaiting neonatology team. Uterine massage was then administered and the placenta delivered intact with three-vessel cord. The uterus was then cleared of clot and debris.  The hysterotomy was closed with 0 Vicryl in a running locked fashion, and an imbricating layer was also placed with a 0 Vicryl. Overall, excellent hemostasis was noted. The abdomen and the pelvis were cleared of all clot and debris and the Jon Gillslexis was removed. Hemostasis was confirmed on all surfaces.  The peritoneum was reapproximated using 2-0 vicryl running stitches. The fascia was then closed using 0 Vicryl in a running fashion. The subcutaneous layer was reapproximated with plain gut and the skin was closed with 4-0 vicryl. The patient tolerated the procedure well. Sponge, lap, instrument and needle counts were correct x 2. She was taken to the recovery room in  stable condition.    Cindy Abbot, MD  Ob  Fellow 08/12/2018 1:06 AM  Attestation of Attending Supervision of Obstetric Fellow During Surgery: Surgery was performed by the Obstetric Fellow under my supervision and collaboration.  I have reviewed the Obstetric Fellow's operative report, and I agree with the documentation.  I was present and scrubbed for the entire procedure.    Candelaria Celeste, DO Attending Physician Faculty Practice, [facility]

## 2018-08-12 NOTE — Progress Notes (Signed)
Patient ID: Cindy Levine, female   DOB: 03/23/1996, 23 y.o.   MRN: [id number]  Patient seen for recurrent late and variable decelerations despite minimal pitocin. Unable to increase pitocin and no cervical change in > 1 hr. Decision made with patient to proceed with cesarean delivery.  The risks of cesarean section discussed with the patient included but were not limited to: bleeding which may require transfusion or reoperation; infection which may require antibiotics; injury to bowel, bladder, ureters or other surrounding organs; injury to the fetus; need for additional procedures including hysterectomy in the event of a life-threatening hemorrhage; placental abnormalities wth subsequent pregnancies, incisional problems, thromboembolic phenomenon and other postoperative/anesthesia complications. The patient concurred with the proposed plan, giving informed written consent for the procedure.   Patient has been NPO since this AM. She will remain NPO for procedure. Anesthesia and OR aware.  Preoperative prophylactic Ancef ordered on call to the OR.  To OR when ready.  Levie HeritageStinson, Jacob J, DO 08/12/2018 12:02 AM

## 2018-08-12 NOTE — Lactation Note (Cosign Needed)
This note was copied from a baby's chart. Lactation Consultation Note  Patient Name: Cindy Levine [id number] Date: 08/12/2018 Reason for consult: Initial assessment;Term P2, 6 hour female infant/ Per mom, she still was breastfeeding her two year old Mom is active on [facility] program in [place]. Mom has DEBP at home. Mom knows how to hand express. Mom latched infant on right breast using cross cradle hold with ease, LC observed and heard swallows. Infant had been breastfeeding for 5 minutes and was still breastfeeding when LC left room. Mom knows to breastfeed according hunger cues and not to exceed 3 hours without breastfeeding infant.  LC discussed I & O. Reviewed Baby & Me book's Breastfeeding Basics.  Mom made aware of O/P services, breastfeeding support groups, community resources, and our phone # for post-discharge questions.  Maternal Data Formula Feeding for Exclusion: No Has patient been taught Hand Expression?: Yes Does the patient have breastfeeding experience prior to this delivery?: Yes  Feeding Feeding Type: Breast Fed  LATCH Score Latch: Grasps breast easily, tongue down, lips flanged, rhythmical sucking.  Audible Swallowing: Spontaneous and intermittent  Type of Nipple: Everted at rest and after stimulation  Comfort (Breast/Nipple): Soft / non-tender  Hold (Positioning): Assistance needed to correctly position infant at breast and maintain latch.  LATCH Score: 9  Interventions Interventions: Breast feeding basics reviewed;Assisted with latch;Skin to skin;Hand express;Support pillows;Adjust position  Lactation Tools Discussed/Used WIC Program: Yes   Consult Status Consult Status: Follow-up Date: 08/13/18 Follow-up type: In-patient    Danelle Earthly 08/12/2018, 7:06 AM

## 2018-08-12 NOTE — Transfer of Care (Signed)
Immediate Anesthesia Transfer of Care Note  Patient: Cindy Levine  Procedure(s) Performed: CESAREAN SECTION (N/A )  Patient Location: PACU  Anesthesia Type:Epidural  Level of Consciousness: awake, alert  and oriented  Airway & Oxygen Therapy: Patient Spontanous Breathing  Post-op Assessment: Report given to RN and Post -op Vital signs reviewed and stable  Post vital signs: Reviewed and stable HR 100, RR 16, SaO2 100%, BP 105/82  Last Vitals:  Vitals Value Taken Time  BP    Temp    Pulse 120 08/12/2018  1:25 AM  Resp 21 08/12/2018  1:25 AM  SpO2 99 % 08/12/2018  1:25 AM  Vitals shown include unvalidated device data.  Last Pain:  Vitals:   08/11/18 2153  TempSrc: Axillary  PainSc:          Complications: No apparent anesthesia complications

## 2018-08-12 NOTE — Anesthesia Postprocedure Evaluation (Signed)
Anesthesia Post Note  Patient: Cindy Levine  Procedure(s) Performed: CESAREAN SECTION (N/A )     Patient location during evaluation: Mother Baby Anesthesia Type: Epidural Level of consciousness: awake, awake and alert and oriented Pain management: pain level controlled Vital Signs Assessment: post-procedure vital signs reviewed and stable Respiratory status: spontaneous breathing Cardiovascular status: blood pressure returned to baseline Postop Assessment: no headache, no backache, epidural receding, patient able to bend at knees, no apparent nausea or vomiting, adequate PO intake and able to ambulate Anesthetic complications: no    Last Vitals:  Vitals:   08/12/18 0405 08/12/18 0545  BP: 122/79 117/71  Pulse: 88 84  Resp: 15 16  Temp:  36.9 C  SpO2: 97% 98%    Last Pain:  Vitals:   08/12/18 0747  TempSrc:   PainSc: 0-No pain   Pain Goal:                Epidural/Spinal Function Cutaneous sensation: Normal sensation (08/12/18 0747), Patient able to flex knees: Yes (08/12/18 0747), Patient able to lift hips off bed: Yes (08/12/18 0747), Back pain beyond tenderness at insertion site: No (08/12/18 0747), Progressively worsening motor and/or sensory loss: No (08/12/18 0747), Bowel and/or bladder incontinence post epidural: No (08/12/18 0747)  Cleda Clarks

## 2018-08-12 NOTE — Progress Notes (Signed)
This RN released signed and held orders, which included a CBC and serum creatinine at 0500 (if not already drawn) for a baseline before starting Lovenox. Jessica from lab notified this RN in person that there were duplicate orders for lab work. Previous RN had already ordered a CBC which was drawn in PACU at 0219. Lab tech stated that a CMP (which included a serum creatinine) had already been drawn on admission on 08/11/18. RN agreed not to have it drawn if it was already drawn. After reviewing the patient's chart at 0615, no serum creatinine result was found for 08/11/18. RN notified a different phlebotomist on the unit (unsure of her name) that we do still need the serum creatinine drawn. She stated "okay, we'll get it". Has not been drawn yet, day shift RN made aware.  Cindy Levine  Cindy Letendre, RN 08/12/2018 8:05 AM

## 2018-08-12 NOTE — Lactation Note (Signed)
This note was copied from a baby's chart. Lactation Consultation Note  Patient Name: Cindy Levine [id number] Date: 08/12/2018  P1, 6 hour female infant. LC entered room infant and mom asleep.    Maternal Data    Feeding Feeding Type: Breast Fed  LATCH Score Latch: Grasps breast easily, tongue down, lips flanged, rhythmical sucking.  Audible Swallowing: A few with stimulation  Type of Nipple: Everted at rest and after stimulation  Comfort (Breast/Nipple): Soft / non-tender  Hold (Positioning): Assistance needed to correctly position infant at breast and maintain latch.  LATCH Score: 8  Interventions Interventions: Assisted with latch;Breast massage;Hand express;Breast compression;Adjust position;Support pillows;Breast feeding basics reviewed  Lactation Tools Discussed/Used     Consult Status      Danelle Earthly 08/12/2018, 6:47 AM

## 2018-08-13 LAB — PREPARE RBC (CROSSMATCH)

## 2018-08-13 LAB — CBC
HCT: 18.7 % — ABNORMAL LOW (ref 36.0–46.0)
Hemoglobin: 5.8 g/dL — CL (ref 12.0–15.0)
MCH: 23.6 pg — ABNORMAL LOW (ref 26.0–34.0)
MCHC: 31 g/dL (ref 30.0–36.0)
MCV: 76 fL — ABNORMAL LOW (ref 80.0–100.0)
Platelets: 126 10*3/uL — ABNORMAL LOW (ref 150–400)
RBC: 2.46 MIL/uL — AB (ref 3.87–5.11)
RDW: 17.1 % — ABNORMAL HIGH (ref 11.5–15.5)
WBC: 9.2 10*3/uL (ref 4.0–10.5)
nRBC: 0.4 % — ABNORMAL HIGH (ref 0.0–0.2)

## 2018-08-13 LAB — CREATININE, SERUM
Creatinine, Ser: 0.77 mg/dL (ref 0.44–1.00)
GFR calc Af Amer: >60 mL/min (ref >60)
GFR calc non Af Amer: >60 mL/min (ref >60)

## 2018-08-13 MED ORDER — IBUPROFEN 100 MG/5ML PO SUSP
600.0000 mg | Freq: Four times a day (QID) | ORAL | Status: DC
  Administered 2018-08-13 – 2018-08-14 (×6): 600 mg via ORAL
  Filled 2018-08-13 (×16): qty 30

## 2018-08-13 MED ORDER — ACETAMINOPHEN 160 MG/5ML PO SOLN
650.0000 mg | ORAL | Status: DC | PRN
  Administered 2018-08-13: 650 mg via ORAL
  Filled 2018-08-13: qty 20.3

## 2018-08-13 MED ORDER — SODIUM CHLORIDE 0.9% IV SOLUTION
Freq: Once | INTRAVENOUS | Status: AC
  Administered 2018-08-13: 19:00:00 via INTRAVENOUS

## 2018-08-13 NOTE — Lactation Note (Signed)
This note was copied from a baby's chart. Lactation Consultation Note  Patient Name: Boy Emmaly Lockwood [id number] Date: 08/13/2018 Reason for consult: Follow-up assessment;Term  P2 mother whose infant is now 1 hours old.  Mother has breast feeding experience with her 25 year old.  Baby was sleeping when I arrived.  Mother had no questions/concerns related to breast feeding.  Her breasts are soft and non tender and nipples are everted and intact.  She has been feeding baby on cue and states that he is awakening for feeds.   Encouraged to continue feeding 8-12 times/24 hours or sooner if baby shows cues.  Encouraged calling for latch assistance as needed.     Maternal Data Formula Feeding for Exclusion: No Has patient been taught Hand Expression?: Yes Does the patient have breastfeeding experience prior to this delivery?: Yes  Feeding    LATCH Score                   Interventions    Lactation Tools Discussed/Used WIC Program: Yes   Consult Status Consult Status: Follow-up Date: 08/14/18 Follow-up type: In-patient    Dora Sims 08/13/2018, 2:43 PM

## 2018-08-13 NOTE — Progress Notes (Addendum)
Post Partum Day 1 Subjective: no complaints, up ad lib, voiding, tolerating PO and + flatus. The patient is seen resting in bed with her baby. She is still feeling a little sore in her abdomen at the incision site. She has been ambulating the room but has not yet ambulated the hallway.   Objective: Blood pressure 116/65, pulse 99, temperature 98.2 F (36.8 C), temperature source Oral, resp. rate 18, height 5\' 8"  (1.727 m), weight 85.8 kg, last menstrual period 10/28/2017, SpO2 100 %, unknown if currently breastfeeding.  Physical Exam:  General: alert, cooperative and no distress Lochia: appropriate Uterine Fundus: firm Incision: healing well, no significant drainage, no significant erythema DVT Evaluation: No evidence of DVT seen on physical exam. No cords or calf tenderness.  Recent Labs    08/11/18 0735 08/12/18 0219  HGB 8.4* 7.3*  HCT 28.2* 23.3*    Assessment/Plan: Plan for discharge tomorrow, Breastfeeding and Contraception PATCH.  Hgb 7.3: plan for repeat CBC and possible feraheme   LOS: 2 days   Cindy Levine 08/13/2018, 11:51 AM   Attestation of Attending Supervision of Resident: Evaluation and management procedures were performed by the [facility] Resident under my supervision. I was immediately available for direct supervision, assistance and direction throughout this encounter.  I also confirm that I have verified the information documented in the resident's note, and that I have also personally reperformed the pertinent components of the physical exam and all of the medical decision making activities.   Repeat Hb today showed Hb 5.8 (from 7.3 yesterday). Normal lochia without evidence of hemorrhage or clots. Patient is symptomatic with PIKA (although history demonstrates this is long term PIKA), denies dizziness/lightheadedness, SOB, weakness with ambulation. +edema in bilateral LE. She is attempting to breastfeed. Discussed benefits/risks of blood transfusion  with patient, she elects to have blood transfusion, consented for transfusion. 2 units to be given.   Jen Mow, DO OB Fellow, [facility] for [employer], [facility] 08/13/2018  4:33 PM

## 2018-08-13 NOTE — Progress Notes (Signed)
CRITICAL VALUE ALERT  Critical Value:  5.8 hgb  Date & Time Notied:  08/13/18  Provider Notified: Dareen Piano, DO  Orders Received/Actions taken: see orders

## 2018-08-13 NOTE — Progress Notes (Signed)
Notified T/S blood transfusion complete at 2100. MD to put in lab orders.

## 2018-08-14 LAB — BPAM RBC
Blood Product Expiration Date: 202003062359
Blood Product Expiration Date: 202003062359
ISSUE DATE / TIME: 202002061534
ISSUE DATE / TIME: 202002061822
Unit Type and Rh: 5100
Unit Type and Rh: 5100

## 2018-08-14 LAB — TYPE AND SCREEN
ABO/RH(D): O POS
Antibody Screen: NEGATIVE
Unit division: 0
Unit division: 0

## 2018-08-14 LAB — HEMOGLOBIN AND HEMATOCRIT, BLOOD
HCT: 25.3 % — ABNORMAL LOW (ref 36.0–46.0)
Hemoglobin: 7.9 g/dL — ABNORMAL LOW (ref 12.0–15.0)

## 2018-08-14 MED ORDER — IBUPROFEN 800 MG PO TABS: 800.0000 mg | ORAL_TABLET | Freq: Three times a day (TID) | ORAL | 0 refills | Status: DC

## 2018-08-14 MED ORDER — POLYETHYLENE GLYCOL 3350 17 G PO PACK: PACK | ORAL | 0 refills | Status: DC

## 2018-08-14 MED ORDER — SENNOSIDES-DOCUSATE SODIUM 8.6-50 MG PO TABS: 2.0000 | ORAL_TABLET | ORAL | 0 refills | Status: DC

## 2018-08-14 MED ORDER — OXYCODONE HCL 5 MG PO CAPS: 5.0000 mg | ORAL_CAPSULE | Freq: Four times a day (QID) | ORAL | 0 refills | Status: DC | PRN

## 2018-08-14 MED ORDER — FERROUS SULFATE 325 (65 FE) MG PO TABS: 325.0000 mg | ORAL_TABLET | ORAL | 0 refills | Status: DC

## 2018-08-14 NOTE — Lactation Note (Signed)
This note was copied from a baby's chart. Lactation Consultation Note: Infant is 3362 hours old and is 8% weight loss.  Mother reports that infant has had a stool and she plans to be discharged this p,m.  Mother reports that she is supplementing infant with ebm. She last pumped 10 ml. Mother is very firm and full.  Advised mother to post pump again after good massage for 15-20 mins  And every 2-3 hours .  Mother taught to do good breast massage.  She has ice at the bedside.   Encouraged mother to breastfeed infant on cue and at least 8-12 times in 24 hours. Discussed cluster feeding.  Mother was given a harmony hand pump with instructions.  Mother reports that she has an appt with WIC on Monday.  [facility]  Mother was left paperwork for rental.  She reports that she would like to take home a loaner.   Discussed treatment and prevention of engorgement.   Mother is aware of available LC services and community support.  Patient Name: Cindy Adolphus BirchwoodRaushanda Gude WUJWJ'XToday's Date: 08/14/2018 Reason for consult: Follow-up assessment   Maternal Data    Feeding Feeding Type: Formula  LATCH Score                   Interventions Interventions: Breast massage;Expressed milk;Ice  Lactation Tools Discussed/Used     Consult Status Consult Status: Follow-up Date: 08/14/18 Follow-up type: In-patient    Stevan BornKendrick, Jameon Deller [facility] 08/14/2018, 3:41 PM

## 2018-08-14 NOTE — Discharge Instructions (Signed)
High-Fiber Diet Fiber, also called dietary fiber, is a type of carbohydrate that is found in fruits, vegetables, whole grains, and beans. A high-fiber diet can have many health benefits. Your health care provider may recommend a high-fiber diet to help:  Prevent constipation. Fiber can make your bowel movements more regular.  Lower your cholesterol.  Relieve the following conditions: ? Swelling of veins in the anus (hemorrhoids). ? Swelling and irritation (inflammation) of specific areas of the digestive tract (uncomplicated diverticulosis). ? A problem of the large intestine (colon) that sometimes causes pain and diarrhea (irritable bowel syndrome, IBS).  Prevent overeating as part of a weight-loss plan.  Prevent heart disease, type 2 diabetes, and certain cancers. What is my plan? The recommended daily fiber intake in grams (g) includes:  38 g for men age 60 or younger.  30 g for men over age 55.  45 g for women age 54 or younger.  21 g for women over age 81. You can get the recommended daily intake of dietary fiber by:  Eating a variety of fruits, vegetables, grains, and beans.  Taking a fiber supplement, if it is not possible to get enough fiber through your diet. What do I need to know about a high-fiber diet?  It is better to get fiber through food sources rather than from fiber supplements. There is not a lot of research about how effective supplements are.  Always check the fiber content on the nutrition facts label of any prepackaged food. Look for foods that contain 5 g of fiber or more per serving.  Talk with a diet and nutrition specialist (dietitian) if you have questions about specific foods that are recommended or not recommended for your medical condition, especially if those foods are not listed below.  Gradually increase how much fiber you consume. If you increase your intake of dietary fiber too quickly, you may have bloating, cramping, or gas.  Drink plenty  of water. Water helps you to digest fiber. What are tips for following this plan?  Eat a wide variety of high-fiber foods.  Make sure that half of the grains that you eat each day are whole grains.  Eat breads and cereals that are made with whole-grain flour instead of refined flour or white flour.  Eat brown rice, bulgur wheat, or millet instead of white rice.  Start the day with a breakfast that is high in fiber, such as a cereal that contains 5 g of fiber or more per serving.  Use beans in place of meat in soups, salads, and pasta dishes.  Eat high-fiber snacks, such as berries, raw vegetables, nuts, and popcorn.  Choose whole fruits and vegetables instead of processed forms like juice or sauce. What foods can I eat?  Fruits Berries. Pears. Apples. Oranges. Avocado. Prunes and raisins. Dried figs. Vegetables Sweet potatoes. Spinach. Kale. Artichokes. Cabbage. Broccoli. Cauliflower. Green peas. Carrots. Squash. Grains Whole-grain breads. Multigrain cereal. Oats and oatmeal. Brown rice. Barley. Bulgur wheat. Garden. Quinoa. Bran muffins. Popcorn. Rye wafer crackers. Meats and other proteins Navy, kidney, and pinto beans. Soybeans. Split peas. Lentils. Nuts and seeds. Dairy Fiber-fortified yogurt. Beverages Fiber-fortified soy milk. Fiber-fortified orange juice. Other foods Fiber bars. The items listed above may not be a complete list of recommended foods and beverages. Contact a dietitian for more options. What foods are not recommended? Fruits Fruit juice. Cooked, strained fruit. Vegetables Fried potatoes. Canned vegetables. Well-cooked vegetables. Grains White bread. Pasta made with refined flour. White rice. Meats and other  proteins Fatty cuts of meat. Fried chicken or fried fish. Dairy Milk. Yogurt. Cream cheese. Sour cream. Fats and oils Butters. Beverages Soft drinks. Other foods Cakes and pastries. The items listed above may not be a complete list of foods  and beverages to avoid. Contact a dietitian for more information. Summary  Fiber is a type of carbohydrate. It is found in fruits, vegetables, whole grains, and beans.  There are many health benefits of eating a high-fiber diet, such as preventing constipation, lowering blood cholesterol, helping with weight loss, and reducing your risk of heart disease, diabetes, and certain cancers.  Gradually increase your intake of fiber. Increasing too fast can result in cramping, bloating, and gas. Drink plenty of water while you increase your fiber.  The best sources of fiber include whole fruits and vegetables, whole grains, nuts, seeds, and beans. This information is not intended to replace advice given to you by your health care provider. Make sure you discuss any questions you have with your health care provider. Document Released: 06/24/2005 Document Revised: 04/28/2017 Document Reviewed: 04/28/2017 Elsevier Interactive Patient Education  2019 [employer]. Iron-Rich Diet  Iron is a mineral that helps your body to produce hemoglobin. Hemoglobin is a protein in red blood cells that carries oxygen to your body's tissues. Eating too little iron may cause you to feel weak and tired, and it can increase your risk of infection. Iron is naturally found in many foods, and many foods have iron added to them (iron-fortified foods). You may need to follow an iron-rich diet if you do not have enough iron in your body due to certain medical conditions. The amount of iron that you need each day depends on your age, your sex, and any medical conditions you have. Follow instructions from your health care provider or a diet and nutrition specialist (dietitian) about how much iron you should eat each day. What are tips for following this plan? Reading food labels  Check food labels to see how many milligrams (mg) of iron are in each serving. Cooking  Cook foods in pots and pans that are made from iron.  Take  these steps to make it easier for your body to absorb iron from certain foods: ? Soak beans overnight before cooking. ? Soak whole grains overnight and drain them before using. ? Ferment flours before baking, such as by using yeast in bread dough. Meal planning  When you eat foods that contain iron, you should eat them with foods that are high in vitamin C. These include oranges, peppers, tomatoes, potatoes, and mango. Vitamin C helps your body to absorb iron. General information  Take iron supplements only as told by your health care provider. An overdose of iron can be life-threatening. If you were prescribed iron supplements, take them with orange juice or a vitamin C supplement.  When you eat iron-fortified foods or take an iron supplement, you should also eat foods that naturally contain iron, such as meat, poultry, and fish. Eating naturally iron-rich foods helps your body to absorb the iron that is added to other foods or contained in a supplement.  Certain foods and drinks prevent your body from absorbing iron properly. Avoid eating these foods in the same meal as iron-rich foods or with iron supplements. These foods include: ? Coffee, black tea, and red wine. ? Milk, dairy products, and foods that are high in calcium. ? Beans and soybeans. ? Whole grains. What foods should I eat? Fruits Prunes. Raisins. Eat fruits high  in vitamin C, such as oranges, grapefruits, and strawberries, alongside iron-rich foods. °Vegetables °Spinach (cooked). Green peas. Broccoli. Fermented vegetables. °Eat vegetables high in vitamin C, such as leafy greens, potatoes, bell peppers, and tomatoes, alongside iron-rich foods. °Grains °Iron-fortified breakfast cereal. Iron-fortified whole-wheat bread. Enriched rice. Sprouted grains. °Meats and other proteins °Beef liver. Oysters. Beef. Shrimp. Turkey. Chicken. Tuna. Sardines. Chickpeas. Nuts. Tofu. Pumpkin seeds. °Beverages °Tomato juice. Fresh orange juice. Prune  juice. Hibiscus tea. Fortified instant breakfast shakes. °Sweets and desserts °Blackstrap molasses. °Seasonings and condiments °Tahini. Fermented soy sauce. °Other foods °Wheat germ. °The items listed above may not be a complete list of recommended foods and beverages. Contact a dietitian for more information. °What foods should I avoid? °Grains °Whole grains. Bran cereal. Bran flour. Oats. °Meats and other proteins °Soybeans. Products made from soy protein. Black beans. Lentils. Mung beans. Split peas. °Dairy °Milk. Cream. Cheese. Yogurt. Cottage cheese. °Beverages °Coffee. Black tea. Red wine. °Sweets and desserts °Cocoa. Chocolate. Ice cream. °Other foods °Basil. Oregano. Large amounts of parsley. °The items listed above may not be a complete list of foods and beverages to avoid. Contact a dietitian for more information. °Summary °· Iron is a mineral that helps your body to produce hemoglobin. Hemoglobin is a protein in red blood cells that carries oxygen to your body's tissues. °· Iron is naturally found in many foods, and many foods have iron added to them (iron-fortified foods). °· When you eat foods that contain iron, you should eat them with foods that are high in vitamin C. Vitamin C helps your body to absorb iron. °· Certain foods and drinks prevent your body from absorbing iron properly, such as whole grains and dairy products. You should avoid eating these foods in the same meal as iron-rich foods or with iron supplements. °This information is not intended to replace advice given to you by your health care provider. Make sure you discuss any questions you have with your health care provider. °Document Released: 02/05/2005 Document Revised: 05/20/2017 Document Reviewed: 05/20/2017 °Elsevier Interactive Patient Education © 2019 Elsevier Inc. ° °

## 2018-08-15 ENCOUNTER — Ambulatory Visit: Payer: Self-pay

## 2018-08-15 NOTE — Lactation Note (Signed)
This note was copied from a baby's chart. Lactation Consultation Note  Patient Name: Cindy Levine [id number] Date: 08/15/2018 Reason for consult: Follow-up assessment  P2 mother whose infant is now 59 hours old.  Mother is still breast feeding her [age] year old.    Mother has been supplementing with formula but states that her breasts are fuller today.  She pumped 30 mls of EBM this morning for baby.  She had no further questions/concerns related to breast feeding.  Her breasts are full but not engorged and nipples are intact.  Mother will continue to feed 8-12 times/24 hours of sooner if baby shows feeding cues.  She will massage prior to feeding and do hand expression before/after feedings.    Engorgement prevention/treatment reviewed.  Mother has a manual pump and she is currently filling out paperwork to obtain a [facility] loaner pump from me prior to discharge.  Reviewed process to return pump.  Mother has a [facility] appointment on Monday and plans to obtain her pump at that time.  She has our OP phone number for questions/concerns after discharge.  Father present.   Maternal Data Formula Feeding for Exclusion: No Has patient been taught Hand Expression?: Yes Does the patient have breastfeeding experience prior to this delivery?: Yes  Feeding Feeding Type: Breast Milk with Formula added Nipple Type: Slow - flow  LATCH Score                   Interventions    Lactation Tools Discussed/Used WIC Program: Yes   Consult Status Consult Status: Complete Date: 08/15/18 Follow-up type: Call as needed    Dinah Lupa R Saretta Dahlem 08/15/2018, 9:11 AM

## 2018-08-16 ENCOUNTER — Other Ambulatory Visit: Payer: Self-pay | Admitting: Family Medicine

## 2018-08-16 MED ORDER — OXYCODONE HCL 5 MG PO CAPS: 5.0000 mg | ORAL_CAPSULE | Freq: Four times a day (QID) | ORAL | 0 refills | Status: AC | PRN

## 2018-08-26 ENCOUNTER — Encounter: Payer: Self-pay | Admitting: Certified Nurse Midwife

## 2018-08-26 ENCOUNTER — Ambulatory Visit (INDEPENDENT_AMBULATORY_CARE_PROVIDER_SITE_OTHER): Payer: Medicaid Other | Admitting: Certified Nurse Midwife

## 2018-08-26 DIAGNOSIS — Z9889 Other specified postprocedural states: Secondary | ICD-10-CM

## 2018-08-26 NOTE — Progress Notes (Signed)
Ms. Cindy Levine is a [id number] who is s/p primary C/S for failure to progress: arrest of dilation and non reassuring fetal status on 08/12/2018. She is here today for an incision check. Honeycomb dressing still in place, removed without difficulty. Patient tolerated well. Incision C/D/I  No erythema, drainage, edema, or s/x of infection noted in any area along the incisional line. steri-strips in place over incision. Instructed to remove steri-strips as they curl up; ok to get wet in shower - dry with hairdryer on low or cool setting after each time steri-strips get wet. She reports pain well-controlled with ibuprofen. RTO in 2 wks for regular PP visit. Patient verbalized an understanding of the plan of care and agrees.   Sharyon Cable, MSN, CNM 08/26/2018 2:03 PM

## 2018-08-26 NOTE — Patient Instructions (Signed)

## 2018-09-09 ENCOUNTER — Encounter: Payer: Self-pay | Admitting: Obstetrics and Gynecology

## 2018-09-09 ENCOUNTER — Ambulatory Visit (INDEPENDENT_AMBULATORY_CARE_PROVIDER_SITE_OTHER): Payer: Medicaid Other | Admitting: Obstetrics and Gynecology

## 2018-09-09 ENCOUNTER — Other Ambulatory Visit (HOSPITAL_COMMUNITY)
Admission: RE | Admit: 2018-09-09 | Discharge: 2018-09-09 | Disposition: A | Discharge status: Home / Self Care | Payer: Medicaid Other | Source: Ambulatory Visit | Attending: Obstetrics and Gynecology | Admitting: Obstetrics and Gynecology

## 2018-09-09 ENCOUNTER — Other Ambulatory Visit: Payer: Self-pay

## 2018-09-09 DIAGNOSIS — Z1389 Encounter for screening for other disorder: Secondary | ICD-10-CM

## 2018-09-09 DIAGNOSIS — Z3202 Encounter for pregnancy test, result negative: Secondary | ICD-10-CM

## 2018-09-09 LAB — POCT URINE PREGNANCY: Preg Test, Ur: NEGATIVE

## 2018-09-09 MED ORDER — BUTENAFINE HCL 1 % EX CREA: TOPICAL_CREAM | CUTANEOUS | 0 refills | Status: DC

## 2018-09-09 NOTE — Progress Notes (Signed)
Subjective:     Cindy Levine is a 23 y.o. female who presents for a postpartum visit. She is 4 weeks postpartum following a low cervical transverse Cesarean section. I have fully reviewed the prenatal and intrapartum course. The delivery was at 41.1 gestational weeks. Outcome: primary cesarean section, low transverse incision. Anesthesia: epidural. Postpartum course has been Unremarkable. Baby's course has been Unremarkable. Baby is feeding by breast. Bleeding staining only. Bowel function is normal. Bladder function is normal. Patient is not sexually active. Contraception method is none. Postpartum depression screening: negative. Patient is complaining of vaginal irritation without a discharge and is concerned about a yeast infection     Review of Systems Pertinent items are noted in HPI.   Objective:    BP 107/72   Pulse 93   Ht 5\' 7"  (1.702 m)   Wt 153 lb 8 oz (69.6 kg)   LMP 10/28/2017   Breastfeeding Yes   BMI 24.04 kg/m   General:  alert, cooperative and no distress   Breasts:  inspection negative, no nipple discharge or bleeding, no masses or nodularity palpable  Lungs: clear to auscultation bilaterally  Heart:  regular rate and rhythm  Abdomen: soft, non-tender; bowel sounds normal; no masses,  no organomegaly and incision healed completely   Vulva:  normal  Vagina: normal vagina, no discharge, exudate, lesion, or erythema  Cervix:  multiparous appearance  Corpus: normal size, contour, position, consistency, mobility, non-tender  Adnexa:  normal adnexa and no mass, fullness, tenderness  Rectal Exam: Not performed.        Assessment:     Normal postpartum exam. Pap smear and wet prep done at today's visit.   Plan:    1. Contraception: condoms 2. Patient is medically cleared to resume all activities of daily living 3. Follow up in: 1 yeear or as needed.

## 2018-09-10 LAB — CYTOLOGY - PAP: DIAGNOSIS: NEGATIVE

## 2018-09-10 LAB — CERVICOVAGINAL ANCILLARY ONLY
Bacterial vaginitis: POSITIVE — AB
Candida vaginitis: POSITIVE — AB

## 2018-09-14 ENCOUNTER — Other Ambulatory Visit: Payer: Self-pay | Admitting: Obstetrics and Gynecology

## 2018-09-14 MED ORDER — FLUCONAZOLE 150 MG PO TABS: 150.0000 mg | ORAL_TABLET | Freq: Once | ORAL | 0 refills | Status: AC

## 2018-09-14 MED ORDER — METRONIDAZOLE 500 MG PO TABS: 500.0000 mg | ORAL_TABLET | Freq: Two times a day (BID) | ORAL | 0 refills | Status: DC

## 2018-10-28 ENCOUNTER — Other Ambulatory Visit: Payer: Self-pay | Admitting: *Deleted

## 2018-10-28 DIAGNOSIS — B379 Candidiasis, unspecified: Secondary | ICD-10-CM

## 2018-10-28 MED ORDER — TERCONAZOLE 0.4 % VA CREA: 1.0000 | TOPICAL_CREAM | Freq: Every day | VAGINAL | 0 refills | Status: DC

## 2018-10-28 NOTE — Progress Notes (Signed)
Pt request yeast tx.  Terazol 7 sent per protocol.

## 2019-09-03 ENCOUNTER — Other Ambulatory Visit: Payer: Self-pay

## 2019-09-03 ENCOUNTER — Encounter (INDEPENDENT_AMBULATORY_CARE_PROVIDER_SITE_OTHER): Payer: Self-pay | Admitting: Otolaryngology

## 2019-09-03 ENCOUNTER — Ambulatory Visit (INDEPENDENT_AMBULATORY_CARE_PROVIDER_SITE_OTHER): Payer: BC Managed Care – PPO | Admitting: Otolaryngology

## 2019-09-03 VITALS — Temp 97.7°F

## 2019-09-03 DIAGNOSIS — H6123 Impacted cerumen, bilateral: Secondary | ICD-10-CM

## 2019-09-03 NOTE — Progress Notes (Signed)
HPI: Cindy Levine is a 24 y.o. female who presents for evaluation of intermittent blockage of her hearing on the left side.  [facility] and referred here because of wax buildup. She denies any pain in the ear.  She tried using some eardrops to clean wax out of her ears but this was unsuccessful..  Past Medical History:  Diagnosis Date  . Medical history non-contributory    Past Surgical History:  Procedure Laterality Date  . CESAREAN SECTION N/A 08/11/2018   Procedure: CESAREAN SECTION;  Surgeon: Truett Mainland, DO;  Location: [facility];  Service: Obstetrics;  Laterality: N/A;  . NO PAST SURGERIES     Social History   Socioeconomic History  . Marital status: Single    Spouse name: Not on file  . Number of children: Not on file  . Years of education: Not on file  . Highest education level: Not on file  Occupational History  . Not on file  Tobacco Use  . Smoking status: Former Smoker    Types: Cigarettes    Quit date: 2019    Years since quitting: 2.1  . Smokeless tobacco: Never Used  Substance and Sexual Activity  . Alcohol use: No  . Drug use: No  . Sexual activity: Yes    Birth control/protection: None  Other Topics Concern  . Not on file  Social History Narrative  . Not on file   Social Determinants of Health   Financial Resource Strain:   . Difficulty of Paying Living Expenses: Not on file  Food Insecurity:   . Worried About Charity fundraiser in the Last Year: Not on file  . Ran Out of Food in the Last Year: Not on file  Transportation Needs:   . Lack of Transportation (Medical): Not on file  . Lack of Transportation (Non-Medical): Not on file  Physical Activity:   . Days of Exercise per Week: Not on file  . Minutes of Exercise per Session: Not on file  Stress:   . Feeling of Stress : Not on file  Social Connections:   . Frequency of Communication with Friends and Family: Not on file  . Frequency  of Social Gatherings with Friends and Family: Not on file  . Attends Religious Services: Not on file  . Active Member of Clubs or Organizations: Not on file  . Attends Archivist Meetings: Not on file  . Marital Status: Not on file   Family History  Problem Relation Age of Onset  . Diabetes Father   . Prostate cancer Maternal Grandfather   . Hypertension Maternal Grandfather   . Hypertension Paternal Grandmother   . Diabetes Paternal Grandmother    Allergies  Allergen Reactions  . Fish Allergy Shortness Of Breath and Swelling  . Shellfish Allergy Shortness Of Breath and Swelling  . Peanut-Containing Drug Products Other (See Comments)    Allergy test said almonds    Prior to Admission medications   Medication Sig Start Date End Date Taking? Authorizing Provider  Butenafine HCl 1 % cream Apply to affected area twice a day 09/09/18  Yes Constant, Peggy, MD  ibuprofen (ADVIL,MOTRIN) 800 MG tablet Take 1 tablet (800 mg total) by mouth 3 (three) times daily. 08/14/18  Yes Gerri Lins S, DO  metroNIDAZOLE (FLAGYL) 500 MG tablet Take 1 tablet (500 mg total) by mouth 2 (two) times daily. 09/14/18  Yes Constant, Peggy, MD  omeprazole (PRILOSEC) 20 MG capsule Take  1 capsule (20 mg total) by mouth 2 (two) times daily before a meal. 05/29/18  Yes Brock Bad, MD  polyethylene glycol (MIRALAX / GLYCOLAX) packet Mix 1 packet or capful in any beverage 1-2 times to help with regular bowel movements. 08/14/18  Yes Earlene Plater, Laurel S, DO  senna-docusate (SENOKOT-S) 8.6-50 MG tablet Take 2 tablets by mouth daily. 08/15/18  Yes Rhett Bannister S, DO  terconazole (TERAZOL 7) 0.4 % vaginal cream Place 1 applicator vaginally at bedtime. 10/28/18  Yes Brock Bad, MD  triamcinolone ointment (KENALOG) 0.1 % Apply topically to affected areas for up to 14 days of treatment. 06/29/18  Yes Brock Bad, MD  ferrous sulfate 325 (65 FE) MG tablet Take 1 tablet (325 mg total) by mouth every other  day. Patient not taking: Reported on 09/09/2018 08/14/18 10/13/18  Tamera Stands, DO     Positive ROS: Otherwise negative  All other systems have been reviewed and were otherwise negative with the exception of those mentioned in the HPI and as above.  Physical Exam: Constitutional: Alert, well-appearing, no acute distress Ears: External ears without lesions or tenderness. Ear canals with a large amount of wax on both sides.  Left side was worse than right side.  This was cleaned with suction hydroperoxide and irrigation on the left side.  At removal of the wax TMs were clear bilaterally.. Nasal: External nose without lesions. Clear nasal passages Oral: Oropharynx clear. Neck: No palpable adenopathy or masses Respiratory: Breathing comfortably  Skin: No facial/neck lesions or rash noted.  Cerumen impaction removal  Date/Time: 09/03/2019 3:35 PM Performed by: Drema Halon, MD Authorized by: Drema Halon, MD   Consent:    Consent obtained:  Verbal   Consent given by:  Patient   Risks discussed:  Pain and bleeding Procedure details:    Location:  L ear and R ear   Procedure type: curette, irrigation and suction   Post-procedure details:    Inspection:  TM intact and canal normal   Hearing quality:  Improved   Patient tolerance of procedure:  Tolerated well, no immediate complications Comments:     TMs are clear bilaterally.    Assessment: Cerumen impactions  Plan: This was cleaned in the office. She will follow-up as needed.  Narda Bonds, MD

## 2019-09-10 ENCOUNTER — Ambulatory Visit: Payer: Self-pay | Admitting: Allergy

## 2021-01-19 ENCOUNTER — Other Ambulatory Visit: Payer: Self-pay

## 2021-01-19 ENCOUNTER — Encounter: Payer: Self-pay | Admitting: Allergy

## 2021-01-19 ENCOUNTER — Telehealth: Payer: Self-pay | Admitting: *Deleted

## 2021-01-19 ENCOUNTER — Ambulatory Visit (INDEPENDENT_AMBULATORY_CARE_PROVIDER_SITE_OTHER): Payer: BC Managed Care – PPO | Admitting: Allergy

## 2021-01-19 VITALS — BP 94/62 | HR 72 | Temp 98.0°F | Resp 16 | Ht 66.2 in | Wt 133.8 lb

## 2021-01-19 DIAGNOSIS — L2089 Other atopic dermatitis: Secondary | ICD-10-CM | POA: Diagnosis not present

## 2021-01-19 DIAGNOSIS — J3089 Other allergic rhinitis: Secondary | ICD-10-CM

## 2021-01-19 DIAGNOSIS — T7800XD Anaphylactic reaction due to unspecified food, subsequent encounter: Secondary | ICD-10-CM

## 2021-01-19 DIAGNOSIS — T781XXA Other adverse food reactions, not elsewhere classified, initial encounter: Secondary | ICD-10-CM

## 2021-01-19 DIAGNOSIS — H1013 Acute atopic conjunctivitis, bilateral: Secondary | ICD-10-CM

## 2021-01-19 MED ORDER — CLOBETASOL PROPIONATE 0.05 % EX OINT: TOPICAL_OINTMENT | CUTANEOUS | 3 refills | Status: DC

## 2021-01-19 MED ORDER — MUPIROCIN 2 % EX OINT: TOPICAL_OINTMENT | CUTANEOUS | 3 refills | Status: DC

## 2021-01-19 MED ORDER — OLOPATADINE HCL 0.2 % OP SOLN: OPHTHALMIC | 5 refills | Status: DC

## 2021-01-19 MED ORDER — LEVOCETIRIZINE DIHYDROCHLORIDE 5 MG PO TABS: ORAL_TABLET | ORAL | 5 refills | Status: DC

## 2021-01-19 MED ORDER — EUCRISA 2 % EX OINT: TOPICAL_OINTMENT | CUTANEOUS | 3 refills | Status: AC

## 2021-01-19 MED ORDER — EPINEPHRINE 0.3 MG/0.3ML IJ SOAJ: INTRAMUSCULAR | 3 refills | Status: DC

## 2021-01-19 NOTE — Patient Instructions (Addendum)
Eczema -Bathe and soak for 10 minutes in warm water once a day. Pat dry.  Immediately apply the below cream prescribed to red areas only. Wait 10 minutes and then apply Eucerin or Lubriderm or Cetaphil or Aquaphor twice a day all over. Use the cream in the severe areas and lotion of these creams elsewhere.  To affected areas on the face and neck, apply: Eucrisa ointment twice a day as needed. This is a non-steroid ointment safe to use anywhere on body.  Be careful to avoid the eyes. To affected areas on the body (below the face and neck), apply: Clobetasol ointment twice a day as needed.  Do not use more than 2 weeks straight.  This is a steroid ointment for use on body Eucrisa can be used layered with steroid or alone.   With ointments be careful to avoid the armpits and groin area.  -Use mupirocin twice a day as needed to open skin areas.  This is an antibiotic ointment to help prevent skin infection and also help heal open skin areas -You likely will benefit from Dupixent which is an injectable medication for eczema control.  Dupixent has worked well for those with moderate to severe eczema in decreasing severity and frequency of flares.  Benefits and risks discussed today.  Brochure provided.  Let us know if this is a therapy you would like to proceed with  -Make a note of any foods that make eczema worse. -Keep finger nails trimmed. -Wet-to-dry wraps discussed and handout provided on how to perform to help hydrate skin more -Environmental allergy testing is positive to grass pollen, tree pollen, weed pollen, mold, dust mite, cat, cockroach Allergen avoidance measures discussed  Food allergy -Skin testing for seafood is very positive to fish and positive to crab, lobster and oyster.  Shrimp and banana are negative.  Thus will obtain shellfish panel to determine if can perform in-office challenge to shrimp -Continue avoidance of fish and shellfish -Have access to self-injectable epinephrine  (Epipen or AuviQ) 0.3mg  at all times -Follow emergency action plan in case of allergic reaction  -The oral allergy syndrome (OAS) or pollen-food allergy syndrome (PFAS) is a relatively common form of food allergy, particularly in adults. It typically occurs in people who have pollen allergies when the immune system "sees" proteins on the food that look like proteins on the pollen. This results in the allergy antibody (IgE) binding to the food instead of the pollen. Patients typically report itching and/or mild swelling of the mouth and throat immediately following ingestion of certain uncooked fruits (including nuts) or raw vegetables. Only a very small number of affected individuals experience systemic allergic reactions, such as anaphylaxis which occurs with true food allergies.   This is what is occurring with banana and kiwi.     Allergic rhinitis with conjunctivitis -allergy testing as above -for general allergy symptoms can try Xyzal 5mg  daily as needed -for nasal congestion/drainage can try Nasacort 2 sprays each nostril daily for 1-2 weeks at a time before stopping once nasal congestion improves for maximum benefit -for itchy/watery eyes can use Pataday 1 drop each eye daily as needed -these are all over-the-counter allergy medications -allergen immunotherapy discussed today including protocol, benefits and risk.  Informational handout provided.  If interested in this therapuetic option you can check with your insurance carrier for coverage.  Let know if you would like to proceed with this option.     Follow-up in 3-4 months or sooner if needed

## 2021-01-19 NOTE — Telephone Encounter (Signed)
PA has been submitted through CoverMyMeds for Eucrisa and is currently pending approval/denial.  
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
## 2021-01-22 LAB — ALLERGEN PROFILE, SHELLFISH
Clam IgE: 11.8 kU/L — AB
F023-IgE Crab: 40.3 kU/L — AB
F080-IgE Lobster: 38.7 kU/L — AB
F290-IgE Oyster: 6.78 kU/L — AB
Scallop IgE: 12.6 kU/L — AB
Shrimp IgE: 45.2 kU/L — AB

## 2022-02-13 LAB — OB RESULTS CONSOLE ABO/RH: RH Type: POSITIVE

## 2022-02-13 LAB — OB RESULTS CONSOLE RPR: RPR: NONREACTIVE

## 2022-02-13 LAB — HEPATITIS C ANTIBODY: HCV Ab: NEGATIVE

## 2022-02-13 LAB — OB RESULTS CONSOLE HIV ANTIBODY (ROUTINE TESTING): HIV: NONREACTIVE

## 2022-02-13 LAB — OB RESULTS CONSOLE GC/CHLAMYDIA
Chlamydia: NEGATIVE
Neisseria Gonorrhea: NEGATIVE

## 2022-02-13 LAB — OB RESULTS CONSOLE ANTIBODY SCREEN: Antibody Screen: NEGATIVE

## 2022-02-13 LAB — OB RESULTS CONSOLE RUBELLA ANTIBODY, IGM: Rubella: IMMUNE

## 2022-02-13 LAB — OB RESULTS CONSOLE HEPATITIS B SURFACE ANTIGEN: Hepatitis B Surface Ag: NEGATIVE

## 2022-07-08 NOTE — L&D Delivery Note (Addendum)
Delivery Note Pt labored well to complete. She pushed for about an hour and at 10:57 PM a viable female was delivered via Vaginal, Spontaneous (Presentation: ROA).  APGAR: pending ( 4,8) ; weight  pending Loose nuchal x 1 was delivered over infants head.  Posterior shoulder was delivered manually in McRoberts position and after suprapubic pressure failed to effect delivery of anterior shoulder.  Anterior shoulder was then also manually delivered next due to poor maternal effort. Cord was quickly clamped and cut due to poor fetal tone and color at delivery.  Cord gas and blood were then obtained.  Placenta delivered quickly next.    Placenta status: Spontaneous, Intact.  Cord: 3vc  with the following complications:  none.   Brisk bleeding was noted after delivery of placenta despite firm uterus. A dose of txa was given. Shortly afterwards another bleed was noted and fundus noted to be mildly boggy hence 876mg rectal cytotec was placed. Fundus firmed up.   Anesthesia: Epidural Episiotomy:  none Lacerations:  first degree vaginal and perineal lac Suture Repair: 2.0 vicryl rapide Est. Blood Loss (mL):  2063m Mom to postpartum.  Baby to Couplet care / Skin to Skin They desire circumcision for baby - discussed procedure and expectations. Consent confirmed .  CeIsaiah Serge/11/2022, 11:27 PM

## 2022-08-09 ENCOUNTER — Non-Acute Institutional Stay (HOSPITAL_COMMUNITY)
Admission: RE | Admit: 2022-08-09 | Discharge: 2022-08-09 | Disposition: A | Discharge status: Home / Self Care | Payer: BC Managed Care – PPO | Source: Ambulatory Visit | Attending: Internal Medicine | Admitting: Internal Medicine

## 2022-08-09 DIAGNOSIS — O99019 Anemia complicating pregnancy, unspecified trimester: Secondary | ICD-10-CM | POA: Insufficient documentation

## 2022-08-09 MED ORDER — SODIUM CHLORIDE 0.9 % IV SOLN: INTRAVENOUS | Status: DC | PRN

## 2022-08-09 MED ORDER — SODIUM CHLORIDE 0.9 % IV SOLN
510.0000 mg | Freq: Once | INTRAVENOUS | Status: AC
  Administered 2022-08-09: 510 mg via INTRAVENOUS
  Filled 2022-08-09: qty 510

## 2022-08-09 NOTE — Progress Notes (Signed)
PATIENT CARE CENTER NOTE  Diagnosis: ICD-10: 768.088: Anemia complicating pregnancy, unspecified trimester   Provider: Carlynn Purl  Procedure: Feraheme 510 mg  Note: Patient received Feraheme 510 mg infusion (dose #1 of 2) via PIV. Pt tolerated infusion well with no adverse reaction. Pt observed for 30 minutes post infusion. AVS given to patient. Pt advised to schedule next appointment at front desk. Pt awake, alert, and oriented at discharge.

## 2022-08-15 ENCOUNTER — Non-Acute Institutional Stay (HOSPITAL_COMMUNITY)
Admission: RE | Admit: 2022-08-15 | Discharge: 2022-08-15 | Disposition: A | Discharge status: Home / Self Care | Payer: BC Managed Care – PPO | Source: Ambulatory Visit | Attending: Internal Medicine | Admitting: Internal Medicine

## 2022-08-15 DIAGNOSIS — O99019 Anemia complicating pregnancy, unspecified trimester: Secondary | ICD-10-CM | POA: Diagnosis present

## 2022-08-15 DIAGNOSIS — Z3A00 Weeks of gestation of pregnancy not specified: Secondary | ICD-10-CM | POA: Insufficient documentation

## 2022-08-15 MED ORDER — SODIUM CHLORIDE 0.9 % IV SOLN: INTRAVENOUS | Status: DC | PRN

## 2022-08-15 MED ORDER — SODIUM CHLORIDE 0.9 % IV SOLN
510.0000 mg | Freq: Once | INTRAVENOUS | Status: AC
  Administered 2022-08-15: 510 mg via INTRAVENOUS
  Filled 2022-08-15: qty 510

## 2022-08-15 NOTE — Progress Notes (Signed)
PATIENT CARE CENTER NOTE    Diagnosis: ICD-10: 867.619: Anemia complicating pregnancy, unspecified trimester   Provider: Carlynn Purl   Procedure: Feraheme 510 mg   Note: Patient received Feraheme 510 mg infusion (dose #2 of 2) via PIV. Pt tolerated infusion well with no adverse reaction. Pt observed for 30 minutes post infusion. AVS offered but patient refused. Patient alert, oriented and ambulatory at discharge.

## 2022-08-21 LAB — OB RESULTS CONSOLE GBS: GBS: NEGATIVE

## 2022-09-06 ENCOUNTER — Encounter (HOSPITAL_COMMUNITY): Payer: Self-pay | Admitting: *Deleted

## 2022-09-06 ENCOUNTER — Encounter (HOSPITAL_COMMUNITY): Payer: Self-pay

## 2022-09-06 ENCOUNTER — Telehealth (HOSPITAL_COMMUNITY): Payer: Self-pay | Admitting: *Deleted

## 2022-09-06 NOTE — Telephone Encounter (Signed)
Preadmission screen  

## 2022-09-09 NOTE — H&P (Addendum)
Cindy Levine is a 27 y.o.G2P1001 female presenting for IOL for TOLAC at 68 5/[redacted]wks gestation. She is dated per 5 week [place]. She had a c/s with her first pregnancy due to breech. Signed consent for VBAC She is GBS neg. NIPT in expected range. OB History     Gravida  2   Para  1   Term  1   Preterm      AB      Living  1      SAB      IAB      Ectopic      Multiple  0   Live Births  1          Past Medical History:  Diagnosis Date   Eczema    Food allergy    Tobacco abuse    Past Surgical History:  Procedure Laterality Date   CESAREAN SECTION N/A 08/11/2018   Procedure: CESAREAN SECTION;  Surgeon: Truett Mainland, DO;  Location: [facility];  Service: Obstetrics;  Laterality: N/A;   Family History: family history includes Diabetes in her father and paternal grandmother; Heart murmur in her paternal uncle; Hypertension in her maternal grandfather and paternal grandmother; Prostate cancer in her maternal grandfather. Social History:  reports that she has been smoking cigarettes. She has never used smokeless tobacco. She reports current alcohol use. She reports that she does not use drugs.     Maternal Diabetes: No Genetic Screening: Normal Maternal Ultrasounds/Referrals: Normal Fetal Ultrasounds or other Referrals:  None Maternal Substance Abuse:  No Significant Maternal Medications:  None Significant Maternal Lab Results:  Group B Strep negative Number of Prenatal Visits:greater than 3 verified prenatal visits Other Comments:  None  Review of Systems  Constitutional:  Positive for activity change and fatigue.  Eyes:  Negative for photophobia and visual disturbance.  Respiratory:  Negative for chest tightness and shortness of breath.   Cardiovascular:  Positive for leg swelling. Negative for chest pain and palpitations.  Gastrointestinal:  Negative for abdominal pain, diarrhea and nausea.  Genitourinary:  Positive for pelvic pain.  Musculoskeletal:   Positive for back pain and gait problem.  Neurological:  Negative for light-headedness and headaches.  Psychiatric/Behavioral:  The patient is nervous/anxious.    Maternal Medical History:  Reason for admission: Nausea. IOL for TOLAC  Contractions: Frequency: rare.   Perceived severity is mild.   Fetal activity: Perceived fetal activity is normal.   Prenatal complications: no prenatal complications Prenatal Complications - Diabetes: none.     currently breastfeeding. Maternal Exam:  Uterine Assessment: Contraction strength is mild.  Contraction frequency is rare.  Abdomen: Patient reports generalized tenderness.  Surgical scars: low transverse.   Estimated fetal weight is AGA.   Fetal presentation: vertex Introitus: Normal vulva. Normal vagina.    Fetal Exam Fetal Monitor Review: Baseline rate: 131.    Physical Exam Vitals and nursing note reviewed.  Constitutional:      Appearance: Normal appearance. She is normal weight.  Cardiovascular:     Rate and Rhythm: Normal rate.  Pulmonary:     Effort: Pulmonary effort is normal.  Abdominal:     Tenderness: There is generalized abdominal tenderness.  Genitourinary:    General: Normal vulva.  Musculoskeletal:        General: Normal range of motion.     Cervical back: Normal range of motion.  Skin:    General: Skin is warm and dry.     Capillary Refill: Capillary refill takes 2  to 3 seconds.  Neurological:     General: No focal deficit present.     Mental Status: She is alert and oriented to person, place, and time. Mental status is at baseline.  Psychiatric:        Mood and Affect: Mood normal.        Behavior: Behavior normal.        Thought Content: Thought content normal.        Judgment: Judgment normal.     Prenatal labs: ABO, Rh: O/Positive/-- (08/09 0000) Antibody: Negative (08/09 0000) Rubella: Immune (08/09 0000) RPR: Nonreactive (08/09 0000)  HBsAg: Negative (08/09 0000)  HIV: Non-reactive (08/09  0000)  GBS: Negative/-- (02/14 0000)   Assessment/Plan: [id number] female at 62 5/7wks here for IOL for TOLAC - Admit  - GBS neg - Low dose pitocin overnight; reassess in am - Anticipate svd    Venetia Night Toryn Dewalt 09/09/2022, 6:41 PM

## 2022-09-10 ENCOUNTER — Inpatient Hospital Stay (HOSPITAL_COMMUNITY): Payer: BC Managed Care – PPO | Admitting: Anesthesiology

## 2022-09-10 ENCOUNTER — Inpatient Hospital Stay (HOSPITAL_COMMUNITY)
Admission: AD | Admit: 2022-09-10 | Discharge: 2022-09-12 | DRG: 807 | Disposition: A | Discharge status: Home / Self Care | Payer: BC Managed Care – PPO | Attending: Obstetrics and Gynecology | Admitting: Obstetrics and Gynecology

## 2022-09-10 ENCOUNTER — Other Ambulatory Visit: Payer: Self-pay

## 2022-09-10 ENCOUNTER — Encounter (HOSPITAL_COMMUNITY): Payer: Self-pay | Admitting: Obstetrics and Gynecology

## 2022-09-10 ENCOUNTER — Inpatient Hospital Stay (HOSPITAL_COMMUNITY): Payer: BC Managed Care – PPO

## 2022-09-10 DIAGNOSIS — O34219 Maternal care for unspecified type scar from previous cesarean delivery: Secondary | ICD-10-CM | POA: Diagnosis present

## 2022-09-10 DIAGNOSIS — Z3A39 39 weeks gestation of pregnancy: Secondary | ICD-10-CM | POA: Diagnosis not present

## 2022-09-10 DIAGNOSIS — F17210 Nicotine dependence, cigarettes, uncomplicated: Secondary | ICD-10-CM | POA: Diagnosis present

## 2022-09-10 DIAGNOSIS — Z3490 Encounter for supervision of normal pregnancy, unspecified, unspecified trimester: Secondary | ICD-10-CM

## 2022-09-10 DIAGNOSIS — O26893 Other specified pregnancy related conditions, third trimester: Secondary | ICD-10-CM | POA: Diagnosis present

## 2022-09-10 DIAGNOSIS — O99334 Smoking (tobacco) complicating childbirth: Secondary | ICD-10-CM | POA: Diagnosis present

## 2022-09-10 HISTORY — DX: Encounter for supervision of normal pregnancy, unspecified, unspecified trimester: Z34.90

## 2022-09-10 LAB — RPR: RPR Ser Ql: NONREACTIVE

## 2022-09-10 LAB — CBC
HCT: 33.8 % — ABNORMAL LOW (ref 36.0–46.0)
Hemoglobin: 11.4 g/dL — ABNORMAL LOW (ref 12.0–15.0)
MCH: 28.9 pg (ref 26.0–34.0)
MCHC: 33.7 g/dL (ref 30.0–36.0)
MCV: 85.8 fL (ref 80.0–100.0)
Platelets: 113 10*3/uL — ABNORMAL LOW (ref 150–400)
RBC: 3.94 MIL/uL (ref 3.87–5.11)
RDW: 17.5 % — ABNORMAL HIGH (ref 11.5–15.5)
WBC: 6.1 10*3/uL (ref 4.0–10.5)
nRBC: 0 % (ref 0.0–0.2)

## 2022-09-10 LAB — PLATELET COUNT: Platelets: 109 10*3/uL — ABNORMAL LOW (ref 150–400)

## 2022-09-10 LAB — TYPE AND SCREEN
ABO/RH(D): O POS
Antibody Screen: NEGATIVE

## 2022-09-10 LAB — HIV ANTIBODY (ROUTINE TESTING W REFLEX): HIV Screen 4th Generation wRfx: NONREACTIVE

## 2022-09-10 MED ORDER — LIDOCAINE HCL (PF) 1 % IJ SOLN: 30.0000 mL | INTRAMUSCULAR | Status: DC | PRN

## 2022-09-10 MED ORDER — EPHEDRINE 5 MG/ML INJ
10.0000 mg | INTRAVENOUS | Status: DC | PRN
  Administered 2022-09-10: 10 mg via INTRAVENOUS

## 2022-09-10 MED ORDER — EPHEDRINE 5 MG/ML INJ
10.0000 mg | INTRAVENOUS | Status: DC | PRN
  Administered 2022-09-10: 10 mg via INTRAVENOUS
  Filled 2022-09-10: qty 5

## 2022-09-10 MED ORDER — MISOPROSTOL 200 MCG PO TABS: 800.0000 ug | ORAL_TABLET | Freq: Once | ORAL | Status: AC

## 2022-09-10 MED ORDER — FENTANYL-BUPIVACAINE-NACL 0.5-0.125-0.9 MG/250ML-% EP SOLN
EPIDURAL | Status: DC | PRN
  Administered 2022-09-10: 12 mL/h via EPIDURAL

## 2022-09-10 MED ORDER — TERBUTALINE SULFATE 1 MG/ML IJ SOLN: 0.2500 mg | Freq: Once | INTRAMUSCULAR | Status: DC | PRN

## 2022-09-10 MED ORDER — ACETAMINOPHEN 325 MG PO TABS: 650.0000 mg | ORAL_TABLET | ORAL | Status: DC | PRN

## 2022-09-10 MED ORDER — DIPHENHYDRAMINE HCL 50 MG/ML IJ SOLN: 12.5000 mg | INTRAMUSCULAR | Status: DC | PRN

## 2022-09-10 MED ORDER — FAMOTIDINE 20 MG PO TABS
20.0000 mg | ORAL_TABLET | Freq: Two times a day (BID) | ORAL | Status: DC | PRN
  Administered 2022-09-10 (×2): 20 mg via ORAL
  Filled 2022-09-10 (×2): qty 1

## 2022-09-10 MED ORDER — OXYTOCIN BOLUS FROM INFUSION
333.0000 mL | Freq: Once | INTRAVENOUS | Status: AC
  Administered 2022-09-10: 333 mL via INTRAVENOUS

## 2022-09-10 MED ORDER — OXYTOCIN-SODIUM CHLORIDE 30-0.9 UT/500ML-% IV SOLN
2.5000 [IU]/h | INTRAVENOUS | Status: DC
  Administered 2022-09-10: 2.5 [IU]/h via INTRAVENOUS
  Filled 2022-09-10: qty 500

## 2022-09-10 MED ORDER — ONDANSETRON HCL 4 MG/2ML IJ SOLN: 4.0000 mg | Freq: Four times a day (QID) | INTRAMUSCULAR | Status: DC | PRN

## 2022-09-10 MED ORDER — MISOPROSTOL 200 MCG PO TABS
ORAL_TABLET | ORAL | Status: AC
  Administered 2022-09-10: 800 ug via RECTAL
  Filled 2022-09-10: qty 4

## 2022-09-10 MED ORDER — LACTATED RINGERS IV SOLN: INTRAVENOUS | Status: DC

## 2022-09-10 MED ORDER — FENTANYL-BUPIVACAINE-NACL 0.5-0.125-0.9 MG/250ML-% EP SOLN
12.0000 mL/h | EPIDURAL | Status: DC | PRN
  Filled 2022-09-10: qty 250

## 2022-09-10 MED ORDER — TRANEXAMIC ACID-NACL 1000-0.7 MG/100ML-% IV SOLN: 1000.0000 mg | INTRAVENOUS | Status: AC

## 2022-09-10 MED ORDER — FENTANYL CITRATE (PF) 100 MCG/2ML IJ SOLN: 50.0000 ug | INTRAMUSCULAR | Status: DC | PRN

## 2022-09-10 MED ORDER — LIDOCAINE HCL (PF) 1 % IJ SOLN
INTRAMUSCULAR | Status: DC | PRN
  Administered 2022-09-10: 10 mL via EPIDURAL
  Administered 2022-09-10: 2 mL via EPIDURAL

## 2022-09-10 MED ORDER — PHENYLEPHRINE 80 MCG/ML (10ML) SYRINGE FOR IV PUSH (FOR BLOOD PRESSURE SUPPORT): 80.0000 ug | PREFILLED_SYRINGE | INTRAVENOUS | Status: DC | PRN

## 2022-09-10 MED ORDER — LACTATED RINGERS IV SOLN: 500.0000 mL | INTRAVENOUS | Status: DC | PRN

## 2022-09-10 MED ORDER — LACTATED RINGERS IV SOLN
500.0000 mL | Freq: Once | INTRAVENOUS | Status: AC
  Administered 2022-09-10: 500 mL via INTRAVENOUS

## 2022-09-10 MED ORDER — TRANEXAMIC ACID-NACL 1000-0.7 MG/100ML-% IV SOLN
INTRAVENOUS | Status: AC
  Administered 2022-09-10: 1000 mg via INTRAVENOUS
  Filled 2022-09-10: qty 100

## 2022-09-10 MED ORDER — OXYTOCIN-SODIUM CHLORIDE 30-0.9 UT/500ML-% IV SOLN
1.0000 m[IU]/min | INTRAVENOUS | Status: DC
  Administered 2022-09-10: 1 m[IU]/min via INTRAVENOUS

## 2022-09-10 MED ORDER — SOD CITRATE-CITRIC ACID 500-334 MG/5ML PO SOLN
30.0000 mL | ORAL | Status: DC | PRN
  Administered 2022-09-10: 30 mL via ORAL

## 2022-09-10 NOTE — Plan of Care (Signed)

## 2022-09-10 NOTE — Progress Notes (Signed)
Mekiyah Titlow is a 27 y.o. G2P1001 at 60w6dadmitted for induction of labor due to Elective at term.  Objective: BP 109/74 (BP Location: Left Arm)   Pulse 95   Temp 98.3 F (36.8 C) (Oral)   Resp 16   Ht '5\' 8"'$  (1.727 m)   Wt 85.3 kg   BMI 28.59 kg/m  No intake/output data recorded. No intake/output data recorded.  FHT:  FHR: 140 bpm, variability: moderate,  accelerations:  Present,  decelerations:  Absent UC:   irregular, every 1-4 minutes SVE:   3/80/-2  Labs: Lab Results  Component Value Date   WBC 6.1 09/10/2022   HGB 11.4 (L) 09/10/2022   HCT 33.8 (L) 09/10/2022   MCV 85.8 09/10/2022   PLT 113 (L) 09/10/2022    Assessment / Plan: Induction of labor due to elective reasons,  progressing well on pitocin for TOLAC AROM done with moderate clear fluid return  Anticipated MOD:  NSVD  CIsaiah Serge DO 09/10/2022, 9:51 AM

## 2022-09-10 NOTE — Progress Notes (Addendum)
Patient ID: Cindy Levine, female   DOB: 1995/12/04, 27 y.o.   MRN: [id number] Pt with no complaints. Appreciating some contractions VSS EFM - 130, +accels, no decels, mod variability TOCO - no contractions Pit @ 17ms  GEN - NAD, some anxiety SVE - 4.5/80/-2  A/P: TOLAC - Progressing well on pitocin                    - Continue with current plan

## 2022-09-10 NOTE — Anesthesia Preprocedure Evaluation (Signed)
Anesthesia Evaluation  Patient identified by MRN, date of birth, ID band Patient awake    Reviewed: Allergy & Precautions, H&P , Patient's Chart, lab work & pertinent test results  Airway Mallampati: II  TM Distance: >3 FB Neck ROM: Full    Dental no notable dental hx.    Pulmonary Current Smoker   Pulmonary exam normal breath sounds clear to auscultation       Cardiovascular negative cardio ROS Normal cardiovascular exam Rhythm:Regular Rate:Normal     Neuro/Psych negative neurological ROS  negative psych ROS   GI/Hepatic negative GI ROS, Neg liver ROS,,,  Endo/Other  negative endocrine ROS    Renal/GU negative Renal ROS  negative genitourinary   Musculoskeletal negative musculoskeletal ROS (+)    Abdominal   Peds negative pediatric ROS (+)  Hematology  (+) Blood dyscrasia, anemia Hb 11.4, plt 109 Gestational thrombocytopenia    Anesthesia Other Findings   Reproductive/Obstetrics (+) Pregnancy TOLAC, prior section 2020 NRFHR                             Anesthesia Physical Anesthesia Plan  ASA: 2  Anesthesia Plan: Epidural   Post-op Pain Management:    Induction:   PONV Risk Score and Plan: 3 and Treatment may vary due to age or medical condition  Airway Management Planned: Natural Airway  Additional Equipment: None  Intra-op Plan:   Post-operative Plan:   Informed Consent: I have reviewed the patients History and Physical, chart, labs and discussed the procedure including the risks, benefits and alternatives for the proposed anesthesia with the patient or authorized representative who has indicated his/her understanding and acceptance.       Plan Discussed with: CRNA  Anesthesia Plan Comments:        Anesthesia Quick Evaluation

## 2022-09-10 NOTE — Anesthesia Procedure Notes (Signed)
Epidural Patient location during procedure: OB Start time: 09/10/2022 6:12 PM End time: 09/10/2022 6:20 PM  Staffing Anesthesiologist: Pervis Hocking, DO Performed: anesthesiologist   Preanesthetic Checklist Completed: patient identified, IV checked, risks and benefits discussed, monitors and equipment checked, pre-op evaluation and timeout performed  Epidural Patient position: sitting Prep: DuraPrep and site prepped and draped Patient monitoring: continuous pulse ox, blood pressure, heart rate and cardiac monitor Approach: midline Location: L3-L4 Injection technique: LOR air  Needle:  Needle type: Tuohy  Needle gauge: 17 G Needle length: 9 cm Needle insertion depth: 5.5 cm Catheter type: closed end flexible Catheter size: 19 Gauge Catheter at skin depth: 11 cm Test dose: negative  Assessment Sensory level: T8 Events: blood not aspirated, no cerebrospinal fluid, injection not painful, no injection resistance, no paresthesia and negative IV test  Additional Notes Patient identified. Risks/Benefits/Options discussed with patient including but not limited to bleeding, infection, nerve damage, paralysis, failed block, incomplete pain control, headache, blood pressure changes, nausea, vomiting, reactions to medication both or allergic, itching and postpartum back pain. Confirmed with bedside nurse the patient's most recent platelet count. Confirmed with patient that they are not currently taking any anticoagulation, have any bleeding history or any family history of bleeding disorders. Patient expressed understanding and wished to proceed. All questions were answered. Sterile technique was used throughout the entire procedure. Please see nursing notes for vital signs. Test dose was given through epidural catheter and negative prior to continuing to dose epidural or start infusion. Warning signs of high block given to the patient including shortness of breath, tingling/numbness in  hands, complete motor block, or any concerning symptoms with instructions to call for help. Patient was given instructions on fall risk and not to get out of bed. All questions and concerns addressed with instructions to call with any issues or inadequate analgesia.  Reason for block:procedure for pain

## 2022-09-11 ENCOUNTER — Encounter (HOSPITAL_COMMUNITY): Payer: Self-pay | Admitting: Obstetrics and Gynecology

## 2022-09-11 LAB — CBC
HCT: 31.6 % — ABNORMAL LOW (ref 36.0–46.0)
Hemoglobin: 10.5 g/dL — ABNORMAL LOW (ref 12.0–15.0)
MCH: 29.2 pg (ref 26.0–34.0)
MCHC: 33.2 g/dL (ref 30.0–36.0)
MCV: 88 fL (ref 80.0–100.0)
Platelets: 104 10*3/uL — ABNORMAL LOW (ref 150–400)
RBC: 3.59 MIL/uL — ABNORMAL LOW (ref 3.87–5.11)
RDW: 17.4 % — ABNORMAL HIGH (ref 11.5–15.5)
WBC: 10.1 10*3/uL (ref 4.0–10.5)
nRBC: 0 % (ref 0.0–0.2)

## 2022-09-11 MED ORDER — WITCH HAZEL-GLYCERIN EX PADS: 1.0000 | MEDICATED_PAD | CUTANEOUS | Status: DC | PRN

## 2022-09-11 MED ORDER — ZOLPIDEM TARTRATE 5 MG PO TABS: 5.0000 mg | ORAL_TABLET | Freq: Every evening | ORAL | Status: DC | PRN

## 2022-09-11 MED ORDER — PRENATAL MULTIVITAMIN CH
1.0000 | ORAL_TABLET | Freq: Every day | ORAL | Status: DC
  Administered 2022-09-11: 1 via ORAL
  Filled 2022-09-11: qty 1

## 2022-09-11 MED ORDER — IBUPROFEN 100 MG/5ML PO SUSP
600.0000 mg | Freq: Four times a day (QID) | ORAL | Status: DC
  Administered 2022-09-11 – 2022-09-12 (×3): 600 mg via ORAL
  Filled 2022-09-11 (×3): qty 30

## 2022-09-11 MED ORDER — OXYCODONE HCL 5 MG PO TABS: 10.0000 mg | ORAL_TABLET | ORAL | Status: DC | PRN

## 2022-09-11 MED ORDER — ACETAMINOPHEN 325 MG PO TABS: 650.0000 mg | ORAL_TABLET | ORAL | Status: DC | PRN

## 2022-09-11 MED ORDER — ACETAMINOPHEN 160 MG/5ML PO SOLN: 650.0000 mg | Freq: Four times a day (QID) | ORAL | Status: DC | PRN

## 2022-09-11 MED ORDER — ONDANSETRON HCL 4 MG/2ML IJ SOLN: 4.0000 mg | INTRAMUSCULAR | Status: DC | PRN

## 2022-09-11 MED ORDER — DIPHENHYDRAMINE HCL 25 MG PO CAPS: 25.0000 mg | ORAL_CAPSULE | Freq: Four times a day (QID) | ORAL | Status: DC | PRN

## 2022-09-11 MED ORDER — COMPLETENATE 29-1 MG PO CHEW
1.0000 | CHEWABLE_TABLET | Freq: Every day | ORAL | Status: DC
  Administered 2022-09-11: 1 via ORAL
  Filled 2022-09-11: qty 1

## 2022-09-11 MED ORDER — SENNOSIDES-DOCUSATE SODIUM 8.6-50 MG PO TABS
2.0000 | ORAL_TABLET | Freq: Every day | ORAL | Status: DC
  Filled 2022-09-11 (×2): qty 2

## 2022-09-11 MED ORDER — LACTATED RINGERS IV SOLN: INTRAVENOUS | Status: DC

## 2022-09-11 MED ORDER — OXYCODONE HCL 5 MG PO TABS
5.0000 mg | ORAL_TABLET | ORAL | Status: DC | PRN
  Administered 2022-09-11: 5 mg via ORAL
  Filled 2022-09-11: qty 1

## 2022-09-11 MED ORDER — DIBUCAINE (PERIANAL) 1 % EX OINT: 1.0000 | TOPICAL_OINTMENT | CUTANEOUS | Status: DC | PRN

## 2022-09-11 MED ORDER — ONDANSETRON HCL 4 MG PO TABS: 4.0000 mg | ORAL_TABLET | ORAL | Status: DC | PRN

## 2022-09-11 MED ORDER — TETANUS-DIPHTH-ACELL PERTUSSIS 5-2.5-18.5 LF-MCG/0.5 IM SUSY: 0.5000 mL | PREFILLED_SYRINGE | Freq: Once | INTRAMUSCULAR | Status: DC

## 2022-09-11 MED ORDER — COCONUT OIL OIL
1.0000 | TOPICAL_OIL | Status: DC | PRN
  Administered 2022-09-11: 1 via TOPICAL

## 2022-09-11 MED ORDER — SIMETHICONE 80 MG PO CHEW: 80.0000 mg | CHEWABLE_TABLET | ORAL | Status: DC | PRN

## 2022-09-11 MED ORDER — OXYTOCIN-SODIUM CHLORIDE 30-0.9 UT/500ML-% IV SOLN: 2.5000 [IU]/h | INTRAVENOUS | Status: DC | PRN

## 2022-09-11 MED ORDER — BENZOCAINE-MENTHOL 20-0.5 % EX AERO
1.0000 | INHALATION_SPRAY | CUTANEOUS | Status: DC | PRN
  Administered 2022-09-12: 1 via TOPICAL
  Filled 2022-09-11: qty 56

## 2022-09-11 MED ORDER — IBUPROFEN 600 MG PO TABS
600.0000 mg | ORAL_TABLET | Freq: Four times a day (QID) | ORAL | Status: DC
  Administered 2022-09-11: 600 mg via ORAL
  Filled 2022-09-11 (×3): qty 1

## 2022-09-11 NOTE — Progress Notes (Signed)
Post Partum Day 1 Subjective: Doing well without complaints this morning. Has not yet ambulated or attempted to void, nurse in room to attempt now. Pain controlled. Minimal lochia.   Objective: Patient Vitals for the past 24 hrs:  BP Temp Temp src Pulse Resp SpO2  09/11/22 0622 114/69 98.3 F (36.8 C) Oral 88 18 98 %  09/11/22 0305 117/68 98.4 F (36.9 C) Oral 81 18 99 %  09/11/22 0201 118/65 98.8 F (37.1 C) Oral 90 18 98 %  09/11/22 0117 139/77 -- -- (!) 102 14 --  09/11/22 0102 128/64 -- -- 85 14 --  09/11/22 0047 124/63 -- -- 80 18 --  09/11/22 0032 119/71 99.2 F (37.3 C) Oral 91 16 --  09/11/22 0016 138/78 -- -- (!) 108 18 --  09/11/22 0003 -- -- -- 99 18 --  09/10/22 2334 138/63 -- -- (!) 113 18 --  09/10/22 2313 (!) 105/56 -- -- 97 18 --  09/10/22 2302 103/60 -- -- 97 -- --  09/10/22 2301 (!) 104/55 -- -- (!) 108 -- --  09/10/22 2251 -- -- -- -- -- 100 %  09/10/22 2246 -- -- -- -- -- 100 %  09/10/22 2202 (!) 87/42 -- -- (!) 107 -- --  09/10/22 2152 (!) 98/53 -- -- (!) 107 -- --  09/10/22 2131 (!) 141/63 -- -- (!) 120 -- --  09/10/22 2112 (!) 125/59 98.7 F (37.1 C) Oral (!) 115 18 --  09/10/22 2101 (!) 85/43 -- -- (!) 113 18 --  09/10/22 2043 (!) 96/44 -- -- 91 -- --  09/10/22 2037 -- -- -- -- 18 --  09/10/22 2034 (!) 87/31 -- -- 89 -- --  09/10/22 2016 107/73 -- -- (!) 160 14 --  09/10/22 2002 99/74 -- -- (!) 140 16 --  09/10/22 1932 (!) 111/55 -- -- 72 -- --  09/10/22 1902 111/62 98 F (36.7 C) Oral 82 14 --  09/10/22 1852 (!) 111/58 97.7 F (36.5 C) -- 72 -- --  09/10/22 1847 111/82 -- -- 84 -- --  09/10/22 1841 107/62 -- -- 89 -- --  09/10/22 1837 115/71 -- -- 76 -- --  09/10/22 1832 109/60 -- -- 84 -- --  09/10/22 1819 123/64 -- -- 85 -- --  09/10/22 1812 114/72 -- -- 97 -- --  09/10/22 1405 -- -- -- 83 -- --    Physical Exam:  General: alert, cooperative, and no distress Lochia: appropriate Uterine Fundus: firm DVT Evaluation: No evidence of DVT  seen on physical exam.  Recent Labs    09/10/22 0049 09/10/22 1716 09/11/22 0548  WBC 6.1  --  10.1  HGB 11.4*  --  10.5*  HCT 33.8*  --  31.6*  PLT 113* 109* 104*    No results for input(s): "NA", "K", "CL", "CO2CT", "BUN", "CREATININE", "GLUCOSE", "BILITOT", "ALT", "AST", "ALKPHOS", "PROT", "ALBUMIN" in the last 72 hours.  No results for input(s): "CALCIUM", "MG", "PHOS" in the last 72 hours.  No results for input(s): "PROTIME", "APTT", "INR" in the last 72 hours.  No results for input(s): "PROTIME", "APTT", "INR", "FIBRINOGEN" in the last 72 hours. Assessment/Plan:  Cindy Levine 26 y.o. G2P2002 PPD#1 sp VBAC 1. PPC: routine PP care, follow up void 2. Rh pos 3. Consented for circumcision, baby not yet cleared for circ (delivered at 2257 last night). Plan later today vs. Tomorrow 4. Dispo: anticipate discharge home tomorrow    LOS: 1 day   Rowland Lathe 09/11/2022,  9:13 AM

## 2022-09-11 NOTE — Anesthesia Postprocedure Evaluation (Signed)
Anesthesia Post Note  Patient: Cindy Levine  Procedure(s) Performed: AN AD [place]     Patient location during evaluation: Mother Baby Anesthesia Type: Epidural Level of consciousness: awake and alert Pain management: pain level controlled Vital Signs Assessment: post-procedure vital signs reviewed and stable Respiratory status: spontaneous breathing, nonlabored ventilation and respiratory function stable Cardiovascular status: stable Postop Assessment: no headache, no backache and epidural receding Anesthetic complications: no   No notable events documented.  Last Vitals:  Vitals:   09/11/22 0305 09/11/22 0622  BP: 117/68 114/69  Pulse: 81 88  Resp: 18 18  Temp: 36.9 C 36.8 C  SpO2: 99% 98%    Last Pain:  Vitals:   09/11/22 0720  TempSrc:   PainSc: 0-No pain   Pain Goal:                   [employer]

## 2022-09-11 NOTE — Lactation Note (Signed)
This note was copied from a baby's chart. Lactation Consultation Note  Patient Name: Cindy Levine [id number] Date: 09/11/2022 Age:29 hours Reason for consult: Initial assessment;Term  LC in to visit with P2 Mom of term baby delivered vaginally.  Mom questioning whether baby is getting any colostrum, reviewed breast massage and hand expression.  Mom states baby is latching well, no discomfort and stays feeding for 30-40 mins.  Baby is contented when he comes off the breast.  Baby has not had any output.  Reassured Mom that we look for a minimum of 1 void and 1 stool in the first 24 hrs.    Family entered the room and Mom got distracted. Encouraged STS with baby and offering the breast with feeding cues.  Mom to call out for RN and/LC to assess baby at the breast at next feeding.  Maternal Data Has patient been taught Hand Expression?: Yes Does the patient have breastfeeding experience prior to this delivery?: Yes How long did the patient breastfeed?: 10 months  Feeding Mother's Current Feeding Choice: Breast Milk  Lactation Tools Discussed/Used Tools: Pump Breast pump type: Manual Reason for Pumping: RN gave to Mom Pumping frequency: PRN  Interventions Interventions: Breast feeding basics reviewed;Breast massage;Skin to skin;Hand express;LC Services brochure  Discharge Pump: Manual  Consult Status Consult Status: Follow-up Date: 09/12/22 Follow-up type: [place] 09/11/2022, 10:30 AM

## 2022-09-12 MED ORDER — IBUPROFEN 800 MG PO TABS: 800.0000 mg | ORAL_TABLET | Freq: Three times a day (TID) | ORAL | 1 refills | Status: AC | PRN

## 2022-09-12 NOTE — Discharge Summary (Signed)
Postpartum Discharge Summary  Date of Service updated     Patient Name: Cindy Levine DOB: 07-22-1995 MRN: [id number]  Date of admission: 09/10/2022 Delivery date:09/10/2022  Delivering provider: Carlynn Purl [facility]  Date of discharge: 09/12/2022  Admitting diagnosis: Term pregnancy [Z34.90] Intrauterine pregnancy: [redacted]w[redacted]d    Secondary diagnosis:  Principal Problem:   Term pregnancy  Additional problems: Successful VBAC    Discharge diagnosis: Induction of Labor With Vaginal Delivery   27y.o. yo G2P2002 at 335w6das admitted to the hospital 09/10/2022 for induction of labor.  Indication for induction: Favorable cervix at term.  Patient had an labor course complicated by prior section - TOLAC patient Membrane Rupture Time/Date: 9:39 AM ,09/10/2022   Delivery Method:Vaginal, Spontaneous  Episiotomy: None  Lacerations:  1st degree;Vaginal;Perineal  Details of delivery can be found in separate delivery note.  Patient had a postpartum course complicated bynone. Patient is discharged home 09/12/22.  Newborn Data: Birth date:09/10/2022  Birth time:10:57 PM  Gender:Female  Living status:Living  Apgars:4 ,8  Weight:3232 g                                               Post partum procedures: none Augmentation: AROM and Pitocin Complications: None Physical exam  Vitals:   09/11/22 0940 09/11/22 1402 09/11/22 2155 09/12/22 0640  BP: 118/72 106/67 106/68 106/70  Pulse: 80 85 73 72  Resp: '18 18 18 18  '$ Temp: 98.5 F (36.9 C) 98.1 F (36.7 C) 98.2 F (36.8 C) 97.6 F (36.4 C)  TempSrc: Oral Oral Oral Oral  SpO2: 99% 99%  99%  Weight:      Height:       General: alert, cooperative, and no distress Lochia: appropriate Uterine Fundus: firm Incision: N/A DVT Evaluation: No evidence of DVT seen on physical exam. Negative Homan's sign. No cords or calf tenderness. Labs: Lab Results  Component Value Date   WBC 10.1 09/11/2022   HGB 10.5 (L) 09/11/2022   HCT 31.6 (L) 09/11/2022    MCV 88.0 09/11/2022   PLT 104 (L) 09/11/2022      Latest Ref Rng & Units 08/13/2018   12:28 PM  CMP  Creatinine 0.44 - 1.00 mg/dL 0.77    Edinburgh Score:    09/11/2022    3:05 AM  Edinburgh Postnatal Depression Scale Screening Tool  I have been able to laugh and see the funny side of things. 0  I have looked forward with enjoyment to things. 0  I have blamed myself unnecessarily when things went wrong. 1  I have been anxious or worried for no good reason. 1  I have felt scared or panicky for no good reason. 0  Things have been getting on top of me. 1  I have been so unhappy that I have had difficulty sleeping. 0  I have felt sad or miserable. 0  I have been so unhappy that I have been crying. 0  The thought of harming myself has occurred to me. 0  Edinburgh Postnatal Depression Scale Total 3      After visit meds:  Allergies as of 09/12/2022       Reactions   Fish Allergy Shortness Of Breath, Swelling   Shellfish Allergy Shortness Of Breath, Swelling   Banana Itching   Mouth itching   Kiwi Extract Itching   Lip itching  Medication List     TAKE these medications    clobetasol ointment 0.05 % Commonly known as: TEMOVATE Can apply to eczema below the face and neck twice daily if needed. Do not use for more than two weeks straight. What changed:  how much to take how to take this when to take this reasons to take this additional instructions   Eucrisa 2 % Oint Generic drug: Crisaborole Can apply to eczema twice daily as needed. What changed:  how much to take how to take this when to take this reasons to take this additional instructions   ibuprofen 800 MG tablet Commonly known as: ADVIL Take 1 tablet (800 mg total) by mouth every 8 (eight) hours as needed.         Discharge home in stable condition Infant Feeding: Breast Infant Disposition:home with mother Discharge instruction: per After Visit Summary and Postpartum  booklet. Activity: Advance as tolerated. Pelvic rest for 6 weeks.  Diet: routine diet Anticipated Birth Control: Unsure Postpartum Appointment:6 weeks Follow up Visit: [facility]   09/12/2022 [name], MD

## 2022-09-12 NOTE — Lactation Note (Signed)
This note was copied from a baby's chart. Lactation Consultation Note  Patient Name: Cindy Levine [id number] Date: 09/12/2022 Age:29 hours Reason for consult: Follow-up assessment;Term;Infant weight loss;Breastfeeding assistance (baby awake, attempted to feed, and fell asleep. per mom the baby fed prior to going for a circ. The circ was placed on hold for further evaluation by a specialist.) LC reviewed the doc flow sheets with mom, only one wet documented and per mom thinks there was 2 . LC reminded mom to check the waist band for a wet due to the indicators not being in the waste band. Per mom breast are feeling fuller, and heavier. Per mom didn't think baby was latching as deep has the baby should.  LC offered to assess breast tissue and noted some areola edema, and showed mom where the location of the lips should be for a deep latch.  Per mom didn't think the baby has been getting a  consistent deep latch although latching and swallows. LC showed mom the reverse pressure stretch back exercise and how compressible the areola was after the exercise. LC also provided shells and recommended the steps for latching - breast massage, hand express, prepump if needed and stretch back, firm support.  LC reviewed BF D/C teaching and the [facility] resources.   Maternal Data Has patient been taught Hand Expression?: Yes  Feeding Mother's Current Feeding Choice: Breast Milk  LATCH Score Latch: Too sleepy or reluctant, no latch achieved, no sucking elicited. (baby sleepy)  Audible Swallowing: None  Type of Nipple: Everted at rest and after stimulation  Comfort (Breast/Nipple): Soft / non-tender (some areola edema , showed mom the reverse pressure excercise)  Hold (Positioning): Assistance needed to correctly position infant at breast and maintain latch.  LATCH Score: 5   Lactation Tools Discussed/Used Tools: Shells;Pump;Flanges Flange Size: 24;27 (reviewed the hand pump with mom due to her saying  the flange was hurting. LC showed her how to fit better and pe rmom comfortable with #24 F , #27 F given for when her milk comes in) Breast pump type: Manual Pump Education: Milk Storage;Setup, frequency, and cleaning  Interventions Interventions: Breast feeding basics reviewed;Assisted with latch;Skin to skin;Breast massage;Hand express;Reverse pressure;Adjust position;Shells;Hand pump;Education;LC Services brochure  Discharge Discharge Education: Engorgement and breast care;Warning signs for feeding baby Pump: Manual;DEBP;Personal WIC Program: No  Consult Status Consult Status: Complete Date: 09/12/22    Jerlyn Ly [name] 09/12/2022, 11:02 AM

## 2022-09-12 NOTE — Progress Notes (Addendum)
Post Partum Day 2 Subjective: Doing well without complaints this morning. Ambulating and voiding without issue. Pain controlled. Minimal lochia. Discussed circumcision proces in detail given older son's complications  Objective: Patient Vitals for the past 24 hrs:  BP Temp Temp src Pulse Resp SpO2  09/12/22 0640 106/70 97.6 F (36.4 C) Oral 72 18 99 %  09/11/22 2155 106/68 98.2 F (36.8 C) Oral 73 18 --  09/11/22 1402 106/67 98.1 F (36.7 C) Oral 85 18 99 %  09/11/22 0940 118/72 98.5 F (36.9 C) Oral 80 18 99 %     Physical Exam:  General: alert, cooperative, and no distress Lochia: appropriate Uterine Fundus: firm DVT Evaluation: No evidence of DVT seen on physical exam.  Recent Labs    09/10/22 0049 09/10/22 1716 09/11/22 0548  WBC 6.1  --  10.1  HGB 11.4*  --  10.5*  HCT 33.8*  --  31.6*  PLT 113* 109* 104*    Assessment/Plan:  Cindy Levine 27 y.o. G2P2002 PPD#2 sp VBAC 1. PPC: routine PP care 2. Rh pos 3. Consented for circumcision, planned for this AM 1000 --> tight foreskin with pinpoint opening, baby voiding without issue. Eval by peds, agreed to hold circumcision for outpatient.  4. Dispo: anticipate discharge home today    LOS: 2 days   [name] 09/12/2022, 9:17 AM

## 2022-09-20 ENCOUNTER — Telehealth (HOSPITAL_COMMUNITY): Payer: Self-pay | Admitting: *Deleted

## 2022-09-20 NOTE — Telephone Encounter (Signed)
Mom reports feeling good. No concerns about herself at this time. EPDS=1 [facility] score=3) Mom reports baby is doing well. Feeding, peeing, and pooping without difficulty. Safe sleep reviewed. Mom reports no concerns about baby at present.  Odis Hollingshead, RN 09-20-2022 at 2;48pm

## 2024-01-12 ENCOUNTER — Ambulatory Visit (INDEPENDENT_AMBULATORY_CARE_PROVIDER_SITE_OTHER): Admitting: Internal Medicine

## 2024-01-12 ENCOUNTER — Encounter: Payer: Self-pay | Admitting: Internal Medicine

## 2024-01-12 VITALS — BP 120/66 | HR 68 | Temp 98.0°F | Ht 68.0 in | Wt 156.0 lb

## 2024-01-12 DIAGNOSIS — L299 Pruritus, unspecified: Secondary | ICD-10-CM | POA: Insufficient documentation

## 2024-01-12 DIAGNOSIS — R6889 Other general symptoms and signs: Secondary | ICD-10-CM | POA: Insufficient documentation

## 2024-01-12 DIAGNOSIS — E288 Other ovarian dysfunction: Secondary | ICD-10-CM

## 2024-01-12 DIAGNOSIS — Z872 Personal history of diseases of the skin and subcutaneous tissue: Secondary | ICD-10-CM | POA: Insufficient documentation

## 2024-01-12 DIAGNOSIS — E889 Metabolic disorder, unspecified: Secondary | ICD-10-CM | POA: Diagnosis not present

## 2024-01-12 DIAGNOSIS — L309 Dermatitis, unspecified: Secondary | ICD-10-CM | POA: Diagnosis not present

## 2024-01-12 DIAGNOSIS — H1013 Acute atopic conjunctivitis, bilateral: Secondary | ICD-10-CM

## 2024-01-12 DIAGNOSIS — Z77120 Contact with and (suspected) exposure to mold (toxic): Secondary | ICD-10-CM | POA: Diagnosis not present

## 2024-01-12 DIAGNOSIS — R5383 Other fatigue: Secondary | ICD-10-CM | POA: Diagnosis not present

## 2024-01-12 DIAGNOSIS — D509 Iron deficiency anemia, unspecified: Secondary | ICD-10-CM | POA: Diagnosis not present

## 2024-01-12 LAB — CBC WITH DIFFERENTIAL/PLATELET
Basophils Absolute: 0 K/uL (ref 0.0–0.1)
Basophils Relative: 0.4 % (ref 0.0–3.0)
Eosinophils Absolute: 0.2 K/uL (ref 0.0–0.7)
Eosinophils Relative: 4.3 % (ref 0.0–5.0)
HCT: 38.4 % (ref 36.0–46.0)
Hemoglobin: 12.8 g/dL (ref 12.0–15.0)
Lymphocytes Relative: 34.6 % (ref 12.0–46.0)
Lymphs Abs: 1.5 K/uL (ref 0.7–4.0)
MCHC: 33.2 g/dL (ref 30.0–36.0)
MCV: 86 fl (ref 78.0–100.0)
Monocytes Absolute: 0.3 K/uL (ref 0.1–1.0)
Monocytes Relative: 6.2 % (ref 3.0–12.0)
Neutro Abs: 2.4 K/uL (ref 1.4–7.7)
Neutrophils Relative %: 54.5 % (ref 43.0–77.0)
Platelets: 178 K/uL (ref 150.0–400.0)
RBC: 4.46 Mil/uL (ref 3.87–5.11)
RDW: 13.9 % (ref 11.5–15.5)
WBC: 4.4 K/uL (ref 4.0–10.5)

## 2024-01-12 LAB — COMPREHENSIVE METABOLIC PANEL WITH GFR
ALT: 10 U/L (ref 0–35)
AST: 15 U/L (ref 0–37)
Albumin: 4.4 g/dL (ref 3.5–5.2)
Alkaline Phosphatase: 69 U/L (ref 39–117)
BUN: 8 mg/dL (ref 6–23)
CO2: 26 meq/L (ref 19–32)
Calcium: 9.2 mg/dL (ref 8.4–10.5)
Chloride: 106 meq/L (ref 96–112)
Creatinine, Ser: 0.65 mg/dL (ref 0.40–1.20)
GFR: 118.78 mL/min (ref >60.00)
Glucose, Bld: 85 mg/dL (ref 70–99)
Potassium: 3.5 meq/L (ref 3.5–5.1)
Sodium: 138 meq/L (ref 135–145)
Total Bilirubin: 1.1 mg/dL (ref 0.2–1.2)
Total Protein: 7.7 g/dL (ref 6.0–8.3)

## 2024-01-12 LAB — T4, FREE: Free T4: 0.69 ng/dL (ref 0.60–1.60)

## 2024-01-12 LAB — VITAMIN D 25 HYDROXY (VIT D DEFICIENCY, FRACTURES): VITD: 9.63 ng/mL — ABNORMAL LOW (ref 30.00–100.00)

## 2024-01-12 LAB — TSH: TSH: 0.54 u[IU]/mL (ref 0.35–5.50)

## 2024-01-12 MED ORDER — CLOBETASOL PROPIONATE 0.05 % EX OINT: TOPICAL_OINTMENT | CUTANEOUS | 3 refills | Status: AC

## 2024-01-12 MED ORDER — OLOPATADINE HCL 0.1 % OP SOLN: 1.0000 [drp] | Freq: Two times a day (BID) | OPHTHALMIC | 12 refills | Status: AC

## 2024-01-12 NOTE — Progress Notes (Signed)
 [facility]  Phone: [phone]  - Medical Office Visit -  Visit Date: 01/12/2024 Patient: Cindy Levine   DOB: 04/17/1996   28 y.o. Female  MRN: [id number] Patient Care Team: Jesus Bernardino MATSU, MD as PCP - General (Internal Medicine) Today's Health Care Provider: Bernardino MATSU Jesus, MD  ===========================================    Chief Complaint / Reason for Visit: new pt (Pt is present new pt to est care with pcp would like to have hormones levels check and then vit levels check as well. Has had chest discomfort like someone is hitting her in the chest once she gets up from her sleep. Also itching a lot right before her period does not know why) and Fatigue (Pt is concerns about really tried.)   Background: 28 y.o. female who has Encounter for supervision of normal pregnancy, antepartum; Dermatitis; Anemia affecting pregnancy in third trimester; Encounter for planned induction of labor; Post-dates pregnancy; Term pregnancy; Other fatigue; Contact with mold; Pruritus; History of eczema; and Other general symptoms and signs on their problem list.  Discussed the use of AI scribe software for clinical note transcription with the patient, who gave verbal consent to proceed.  History of Present Illness 28 year old woman with a history of eczema and allergies presents with persistent fatigue and worsening skin symptoms over the past three months. She describes the fatigue as "super tired" and "drowsy," most pronounced in the mornings and evenings, and now interfering with her ability to feel rested despite sleeping 6-8 hours nightly. She reports that until two weeks ago, her sleep was restful, but she now experiences frequent nighttime awakenings due to itching. She denies muscle weakness, fever, or significant weight changes. There is a known history of iron deficiency anemia, and she expresses concern about possible vitamin and hormone deficiencies. Her eczema has been  diffuse and persistent since moving into her current apartment two years ago, with involvement of her face, back of thighs, legs, and right nipple. The eczema is described as mild but widespread, with persistent pruritus that is notably worse in the week prior to menstruation. The itching is severe enough to wake her from sleep and is currently most prominent on her face. She denies hives or welts but does have chronic dry skin. She previously tried Dupixent injections with minimal benefit and discontinued due to cost. She currently uses clobetasol  and triamcinolone  topical steroids, which help somewhat, and takes Claritin for eye symptoms and sneezing. She reports regular menstrual cycles (5-7 days, 4 pads daily, no significant pain), but experiences premenstrual mood swings and a marked increase in itching the week before her period. She notes some persistent galactorrhea since stopping breastfeeding three months ago, and has observed some chin hair growth. She denies hot flashes, night sweats (reports waking up sweating but feeling cold), or changes in libido. No pelvic pain or abnormal vaginal discharge. Eczema is also present on the right nipple. She has a history of allergies, including anaphylaxis to shellfish and worsening environmental allergies since moving into her current apartment. She endorses frequent episodes of itchy, swollen eyes and sneezing, particularly at home. She suspects mold exposure as a trigger, noting visible mold in the shower and water-stained walls near the air conditioner, which was recently replaced. Symptoms improve when she is away from home (e.g., on vacation). Her two sons also have eczema but do not experience fatigue or other similar symptoms. She denies significant respiratory symptoms (shortness of breath, persistent cough, wheezing) except for occasional chest pain when smoking or  after sleeping on the couch. She has scoliosis but no joint pain or swelling. No  gastrointestinal symptoms. No new medications or supplements. She reports eating a lot of fried food and sometimes only once daily since stopping breastfeeding. She denies recent stressors, depression, or anxiety, and describes her mood and energy as otherwise stable. She recently traveled to [place] . No known family history of thyroid or autoimmune disease. She is interested in a comprehensive laboratory evaluation to assess for anemia, vitamin deficiency, thyroid dysfunction, and hormonal imbalance.      Problem overviews updated today: Problem  Other Fatigue  Contact With Mold  Pruritus  History of Eczema  Other General Symptoms and Signs    Medications updated/reviewed: Current Outpatient Medications on File Prior to Visit  Medication Sig   Crisaborole  (EUCRISA ) 2 % OINT Can apply to eczema twice daily as needed. (Patient taking differently: Apply 1 application  topically 2 (two) times daily as needed (eczema flare).)   ibuprofen  (ADVIL ) 800 MG tablet Take 1 tablet (800 mg total) by mouth every 8 (eight) hours as needed.   No current facility-administered medications on file prior to visit.   Medications Discontinued During This Encounter  Medication Reason   clobetasol  ointment (TEMOVATE ) 0.05 % Reorder   Current Meds  Medication Sig   olopatadine  ([facility] OLOPATADINE  HCL) 0.1 % ophthalmic solution Place 1 drop into both eyes 2 (two) times daily.    Allergies:   Allergies as of 01/12/2024 - Review Complete 01/12/2024  Allergen Reaction Noted   Fish allergy  Shortness Of Breath and Swelling 07/23/2013   Shellfish allergy  Shortness Of Breath and Swelling 07/23/2013   Banana Itching 01/19/2021   Kiwi extract Itching 01/19/2021   Past Medical History:  has a past medical history of Allergy , Anemia, Eczema, Food allergy , and Tobacco abuse. Past Surgical History:   has a past surgical history that includes Cesarean section (N/A, 08/11/2018). Social History:   reports that  she has been smoking cigars and cigarettes. She has a 15 pack-year smoking history. She has never used smokeless tobacco. She reports current alcohol use. She reports that she does not use drugs. Family History:  family history includes Alcohol abuse in her father; Cancer in her maternal grandfather; Diabetes in her father and paternal grandmother; Early death in her paternal uncle; Heart disease in her paternal uncle; Heart murmur in her paternal uncle; Hypertension in her maternal grandfather and paternal grandmother; Miscarriages / [place] in her mother; Prostate cancer in her maternal grandfather. Depression Screen and Health Maintenance:    01/12/2024    8:58 AM  PHQ 2/9 Scores  PHQ - 2 Score 0   Health Maintenance  Topic Date Due   Pneumococcal Vaccine 68-30 Years old (1 of 2 - PCV) Never done   Hepatitis B Vaccines (1 of 3 - 19+ 3-dose series) Never done   HPV VACCINES (1 - 3-dose SCDM series) Never done   Cervical Cancer Screening (Pap smear)  09/08/2021   COVID-19 Vaccine (2 - 2024-25 season) 01/28/2024 (Originally 03/09/2023)   INFLUENZA VACCINE  02/06/2024   DTaP/Tdap/Td (5 - Td or Tdap) 06/27/2032   Hepatitis C Screening  Completed   HIV Screening  Completed   Meningococcal B Vaccine  Aged Out   Immunization History  Administered Date(s) Administered   Influenza-Unspecified 03/16/2018   PFIZER(Purple Top)SARS-COV-2 Vaccination 04/21/2020   Tdap 02/22/2012, 03/24/2016, 06/29/2018, 06/27/2022     Objective   Physical ExamBP 120/66   Pulse 68   Temp 98 F (36.7  C) (Temporal)   Ht 5' 8 (1.727 m)   Wt 156 lb (70.8 kg)   LMP 12/21/2023 (Approximate)   SpO2 98%   BMI 23.72 kg/m  Wt Readings from Last 10 Encounters:  01/12/24 156 lb (70.8 kg)  09/09/22 188 lb (85.3 kg)  01/19/21 133 lb 12.8 oz (60.7 kg)  09/09/18 153 lb 8 oz (69.6 kg)  08/26/18 156 lb (70.8 kg)  08/11/18 189 lb 3.2 oz (85.8 kg)  08/07/18 187 lb (84.8 kg)  08/05/18 189 lb 11.2 oz (86 kg)  07/30/18  188 lb (85.3 kg)  07/20/18 183 lb (83 kg)  Vital signs reviewed.  Nursing notes reviewed. Weight trend reviewed. Abnormalities and problem-specific physical exam findings:  puffy swollen conjunctive and periorbital edema, mild without injection.  No sneezing or coughing, diffuse mild eczema General Appearance:  Well developed, well nourished, well-groomed, healthy-appearing female with Body mass index is 23.72 kg/m. No acute distress appreciable.   Skin: Clear and well-hydrated. Pulmonary:  Normal work of breathing at rest, no respiratory distress apparent. SpO2: 98 %  Musculoskeletal: She demonstrates smooth and coordinated movements throughout all major joints.All extremities are intact.  Neurological:  Awake, alert, oriented, and engaged.  No obvious focal neurological deficits or cognitive impairments.  Sensorium seems unclouded.  Psychiatric:  Appropriate mood, pleasant and cooperative demeanor, cheerful and engaged during the exam  Results LABS Iron: decreased Vitamin D : decreased    No results found for any visits on 01/12/24.  No visits with results within 1 Year(s) from this visit.  Latest known visit with results is:  Admission on 09/10/2022, Discharged on 09/12/2022  Component Date Value   WBC 09/10/2022 6.1    RBC 09/10/2022 3.94    Hemoglobin 09/10/2022 11.4 (L)    HCT 09/10/2022 33.8 (L)    MCV 09/10/2022 85.8    MCH 09/10/2022 28.9    MCHC 09/10/2022 33.7    RDW 09/10/2022 17.5 (H)    Platelets 09/10/2022 113 (L)    nRBC 09/10/2022 0.0    ABO/RH(D) 09/10/2022 O POS    Antibody Screen 09/10/2022 NEG    Sample Expiration 09/10/2022                     Value:09/13/2022,2359 Performed at [facility], [address], [place], [place] [address]    RPR Ser Ql 09/10/2022 NON REACTIVE    HIV Screen 4th Generatio* 09/10/2022 Non Reactive    Platelets 09/10/2022 109 (L)    WBC 09/11/2022 10.1    RBC 09/11/2022 3.59 (L)    Hemoglobin 09/11/2022 10.5 (L)    HCT  09/11/2022 31.6 (L)    MCV 09/11/2022 88.0    MCH 09/11/2022 29.2    MCHC 09/11/2022 33.2    RDW 09/11/2022 17.4 (H)    Platelets 09/11/2022 104 (L)    nRBC 09/11/2022 0.0     Assessment & Plan Other fatigue Fatigue, Unspecified Undiagnosed new problem with uncertain prognosis. Patient presents with persistent fatigue and drowsiness for 3 months, now interfering with rest and causing unintentional daytime somnolence. The differential diagnosis is broad and includes iron deficiency anemia, hypothyroidism, vitamin D  deficiency, and sleep disturbance secondary to other conditions. An initial workup is required to determine the underlying etiology. Plan includes ordering a CBC, comprehensive iron panel (ferritin, TIBC, serum iron), CMP, TSH with free T4, and a 25-hydroxy Vitamin D  level. Dermatitis Atopic Dermatitis Chronic condition, not at goal. Patient has diffuse, persistent eczema with an inadequate response to prior  Dupixent and current high-potency topical steroids (clobetasol , triamcinolone ). Associated pruritus is severe enough to disrupt sleep. The condition has a cyclical exacerbation premenstrually and is likely aggravated by environmental factors. Prescription drug management involves continuing clobetasol  and triamcinolone  with monitoring for efficacy and adverse effects. Ordered Total IgE to assess atopic burden. Iron deficiency anemia, unspecified iron deficiency anemia type History of Iron Deficiency Anemia Monitoring for recurrence. Given the patient's history of anemia and presenting symptom of significant fatigue, re-evaluation is medically necessary to rule out recurrence as a contributing factor. This is being assessed with the ordered CBC and iron panel. Metabolic disorder Endocrine Evaluation Diagnostic uncertainty requiring workup. Patient reports persistent galactorrhea three months after cessation of breastfeeding, cyclical premenstrual pruritus, and some hirsutism (chin  hair). These symptoms require evaluation to rule out an underlying hormonal imbalance. Ordered a prolactin level to assess for hyperprolactinemia. Further endocrine assessment will be guided by initial lab results. Contact with mold Plan to follow up after lab results are available. Advise considering moving to a new apartment when the lease is up in January due to potential mold exposure impact on health. Follow up in one to two months after lab results to discuss findings and potential mold exposure impact. 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 Symptoms of itchy, swollen eyes and sneezing have worsened since moving into an apartment with visible mold and water-stained walls. Symptoms improve when away from the home environment, indicating a clear environmental trigger. Continue Claritin for symptomatic relief. Counseled patient on the medical necessity of pursuing professional mold remediation with her landlord and the use of HEPA air filtration to mitigate exposure.   Follow-up: Plan to follow up in 1 month  to review all laboratory results, assess response to initial recommendations, and formulate a comprehensive treatment plan. This may include referrals to Allergy /Immunology or Dermatology depending on findings. General Health Maintenance   Discussed social and financial health, with no current concerns. Order lab work to assess vitamin and hormone levels.     ICD-10-CM   1. Other fatigue  R53.83 CBC with Differential/Platelet    Iron, TIBC and Ferritin Panel    Comprehensive metabolic panel with GFR    T4, free    TSH    VITAMIN D  25 Hydroxy (Vit-D Deficiency, Fractures)    ANA w/Reflex if Positive    IgE    Prolactin    HEP PANEL, GENERAL    clobetasol  ointment (TEMOVATE ) 0.05 %    olopatadine  ([facility]S OLOPATADINE  HCL) 0.1 % ophthalmic solution    2. Dermatitis  L30.9  VITAMIN D  25 Hydroxy (Vit-D Deficiency, Fractures)    IgE    HEP PANEL, GENERAL    clobetasol  ointment (TEMOVATE ) 0.05 %    3. Pruritus  L29.9 HEP PANEL, GENERAL    4. Iron deficiency anemia, unspecified iron deficiency anemia type  D50.9 Iron, TIBC and Ferritin Panel    5. Other ovarian dysfunction  E28.8 T4, free    TSH    Prolactin    6. Metabolic disorder  E88.9 CBC with Differential/Platelet    T4, free    TSH    VITAMIN D  25 Hydroxy (Vit-D Deficiency, Fractures)    7. Contact with mold  Z77.120 CBC with Differential/Platelet    Iron, TIBC and Ferritin Panel    Comprehensive metabolic panel with GFR    T4, free    TSH    VITAMIN D  25 Hydroxy (Vit-D  Deficiency, Fractures)    ANA w/Reflex if Positive    IgE    Prolactin    HEP PANEL, GENERAL    clobetasol  ointment (TEMOVATE ) 0.05 %    olopatadine  (CVS OLOPATADINE  HCL) 0.1 % ophthalmic solution    8. History of eczema  Z87.2 VITAMIN D  25 Hydroxy (Vit-D Deficiency, Fractures)    IgE    clobetasol  ointment (TEMOVATE ) 0.05 %    9. Other general symptoms and signs  R68.89 T4, free    TSH    VITAMIN D  25 Hydroxy (Vit-D Deficiency, Fractures)    Prolactin    HEP PANEL, GENERAL    10. Allergic conjunctivitis of both eyes  H10.13 olopatadine  (CVS OLOPATADINE  HCL) 0.1 % ophthalmic solution       Recommended follow up: Return in about 1 month (around 02/12/2024) for annual preventive care visit or , chronic disease monitoring and management.No future appointments.       Additional notes: This document was synthesized by artificial intelligence ([employer]) using HIPAA-compliant recording of the clinical interaction;   We discussed the use of AI scribe software for clinical note transcription with the patient, who gave verbal consent to proceed.    Additional Info: This encounter employed state-of-the-art, real-time, collaborative documentation. The patient actively reviewed and assisted in updating their electronic medical  record on a shared screen, ensuring transparency and facilitating joint problem-solving for the problem list, overview, and plan. This approach promotes accurate, informed care. The treatment plan was discussed and reviewed in detail, including medication safety, potential side effects, and all patient questions. We confirmed understanding and comfort with the plan. Follow-up instructions were established, including contacting the office for any concerns, returning if symptoms worsen, persist, or new symptoms develop, and precautions for potential emergency department visits.  Initial Appointment Goals:  This initial visit focused on establishing a foundation for the patient's care. We collaboratively reviewed her medical history and medications in detail, updating the chart as shown in the encounter. Given the extensive information, we prioritized addressing her most pressing concerns, which she reported were: new pt (Pt is present new pt to est care with pcp would like to have hormones levels check and then vit levels check as well. Has had chest discomfort like someone is hitting her in the chest once she gets up from her sleep. Also itching a lot right before her period does not know why) and Fatigue (Pt is concerns about really tried.)  While the complexity of the patient's medical picture may necessitate further evaluation in subsequent visits, we were able to develop a preliminary care plan together. To expedite a comprehensive plan at the next visit, we encouraged the patient to gather relevant medical records from previous providers. This collaborative approach will ensure a more complete understanding of the patient's health and inform the development of a personalized care plan. We look forward to continuing the conversation and working together with the patient on achieving her health goals.   Collaborative Documentation:  Today's encounter utilized real-time, dynamic patient engagement.  Patients  actively participate by directly reviewing and assisting in updating their medical records through a shared screen. This transparency empowers patients to visually confirm chart updates made by the healthcare provider.  This collaborative approach facilitates problem management as we jointly update the problem list, problem overview, and assessment/plan. Ultimately, this process enhances chart accuracy and completeness, fostering shared decision-making, patient education, and informed consent for tests and treatments.  Collaborative Treatment Planning:  Treatment plans were discussed and reviewed  in detail.  Explained medication safety and potential side effects.  Encouraged participation and answered all patient questions, confirming understanding and comfort with the plan. Encouraged patient to contact our office if they have any questions or concerns. Agreed on patient returning to office if symptoms worsen, persist, or new symptoms develop.  ----------------------------------------------------- Bernardino KANDICE Cone, MD  01/12/2024 9:57 AM  [facility]:  [phone]

## 2024-01-12 NOTE — Patient Instructions (Addendum)
 Summary of Today's Visit: Today we discussed your ongoing symptoms of fatigue, increased drowsiness, and persistent eczema with itching. You also reported worsening allergy  symptoms (itchy, swollen eyes, sneezing) since moving into your current apartment, and you suspect mold exposure may be a factor. Your eczema has not improved with previous treatments and is especially bothersome the week before your period. You also have a history of iron deficiency anemia and are concerned about possible vitamin or hormone issues. We discussed your continued breast discharge after stopping breastfeeding, some new chin hair, and premenstrual mood swings.  What We Did Today: Reviewed your symptoms and medical history in detail Performed a physical exam Ordered lab tests to check for anemia, vitamin D  levels, thyroid function, iron levels, and hormone levels Discussed possible environmental triggers, including mold, and importance of addressing this in your home  Lab Tests Ordered: Complete blood count (CBC) Iron studies Comprehensive metabolic panel (CMP) Thyroid function tests (TSH, Free T4) Prolactin level Vitamin D  level Total IgE (to check for allergies) Urinalysis  Medications and Treatments: Continue using your topical steroid creams (clobetasol , triamcinolone ) as prescribed Continue taking Claritin for allergy  symptoms Moisturize your skin regularly to help with eczema  Recommendations: Try to improve your diet by eating more balanced meals and reducing fried foods Work with your landlord or building management to address mold and water damage in your apartment; consider using a HEPA air filter if possible Keep track of when your symptoms are worse and any new triggers you notice Avoid known allergens, especially shellfish  When to Call the Office: If you develop new or worsening symptoms such as fever, shortness of breath, chest pain, severe swelling, or signs of infection If your skin  becomes increasingly red, swollen, or starts to drain pus If you have any allergic reactions or trouble breathing  Follow-Up: We will contact you with your lab results as soon as they are available (usually within 1-2 weeks) Please schedule a follow-up appointment in 1-2 weeks to review your results and discuss next steps  Questions or Concerns? If you have any questions before your next appointment, please call our office at [facility] Phone Number].  Thank you for coming in today. We are here to support your health and well-being.    Welcome aboard!   Today's visit was a valuable first step in understanding your health and starting your personalized care journey. We discussed your medical history and medications in detail. Given the extensive information, we prioritized addressing your most pressing concerns.  We understood those concerns to be:  new pt (Pt is present new pt to est care with pcp would like to have hormones levels check and then vit levels check as well. Has had chest discomfort like someone is hitting her in the chest once she gets up from her sleep. Also itching a lot right before her period does not know why) and Fatigue (Pt is concerns about really tried.)   Building a Complete Picture  To create the most effective care plan possible, we may need additional information from previous providers. We encouraged you to gather any relevant medical records for your next visit. This will help us  build a more complete picture and develop a personalized plan together. In the meantime, we'll address your immediate concerns and provide resources to help you manage all of your medical issues.  We encourage you to use MyChart to review these efforts, and to help us  find and correct any omissions or errors in your medical chart.  Managing Your Health Over Time  Managing every aspect of your health in a single visit isn't always feasible, but that's okay.  We addressed your most pressing  concerns today and charted a course for future care. Acute conditions or preventive care measures may require further attention.  We encourage you to schedule a follow-up visit at your earliest convenience to discuss any unresolved issues.  We strongly encourage participation in annual preventive care visits to help us  develop a more thorough understanding of your health and to help you maintain optimal wellness - please inquire about scheduling your next one with us  at your earliest convenience.  Your Satisfaction Matters  It was a pleasure seeing you today!  Your health and satisfaction will always be my top priorities. If you believe your experience today was worthy of a 5-star rating, I'd be grateful for your feedback!  Bernardino KANDICE Cone, MD   Next Steps  Schedule Follow-Up:  We recommend a follow-up appointment in Return in about 1 month (around 02/12/2024) for annual preventive care visit or , chronic disease monitoring and management. If your condition worsens before then, please call us  or seek emergency care. Preventive Care:  Don't forget to schedule your annual preventive care visit!  This important checkup is typically covered by insurance and helps identify potential health issues early.  Typically its 100% insurance covered with no co-pay and helps to get surveillance labwork paid for through your insurance provider.  Sometimes it even lowers your insurance premiums to participate. Medical Information Release:  For any relevant medical information we don't have, please sign a release form so we can obtain it for your records. Lab & X-ray Appointments:  Scheduled any incomplete lab tests today or call us  to schedule.  X-Rays can be done without an appointment at [facility] at [facility] ([address]), M-F 8:30am-noon or 1pm-5pm.  Just tell them you're there for X-rays ordered by Dr. Cone.  We'll receive the results and contact you by phone or MyChart to discuss next  steps.  Bring to Your Next Appointment  Medications: Please bring all your medication bottles to your next appointment to ensure we have an accurate record of your prescriptions. Health Diaries: If you're monitoring any health conditions at home, keeping a diary of your readings can be very helpful for discussions at your next appointment.  Reviewing Your Records  Please Review this early draft of your clinical notes below and the final encounter summary tomorrow on MyChart after its been completed.   Other fatigue -     CBC with Differential/Platelet -     Iron, TIBC and Ferritin Panel -     Comprehensive metabolic panel with GFR -     T4, free -     TSH -     VITAMIN D  25 Hydroxy (Vit-D Deficiency, Fractures) -     ANA w/Reflex if Positive -     IgE -     Prolactin -     HEP PANEL, GENERAL -     Clobetasol  Propionate; Can apply to eczema below the face and neck twice daily if needed. Do not use for more than two weeks straight.  Dispense: 60 g; Refill: 3 -     Olopatadine  HCl; Place 1 drop into both eyes 2 (two) times daily.  Dispense: 5 mL; Refill: 12  Dermatitis -     VITAMIN D  25 Hydroxy (Vit-D Deficiency, Fractures) -     IgE -  HEP PANEL, GENERAL -     Clobetasol  Propionate; Can apply to eczema below the face and neck twice daily if needed. Do not use for more than two weeks straight.  Dispense: 60 g; Refill: 3  Pruritus -     HEP PANEL, GENERAL  Iron deficiency anemia, unspecified iron deficiency anemia type -     Iron, TIBC and Ferritin Panel  Other ovarian dysfunction -     T4, free -     TSH -     Prolactin  Metabolic disorder -     CBC with Differential/Platelet -     T4, free -     TSH -     VITAMIN D  25 Hydroxy (Vit-D Deficiency, Fractures)  Contact with mold -     CBC with Differential/Platelet -     Iron, TIBC and Ferritin Panel -     Comprehensive metabolic panel with GFR -     T4, free -     TSH -     VITAMIN D  25 Hydroxy (Vit-D Deficiency,  Fractures) -     ANA w/Reflex if Positive -     IgE -     Prolactin -     HEP PANEL, GENERAL -     Clobetasol  Propionate; Can apply to eczema below the face and neck twice daily if needed. Do not use for more than two weeks straight.  Dispense: 60 g; Refill: 3 -     Olopatadine  HCl; Place 1 drop into both eyes 2 (two) times daily.  Dispense: 5 mL; Refill: 12  History of eczema -     VITAMIN D  25 Hydroxy (Vit-D Deficiency, Fractures) -     IgE -     Clobetasol  Propionate; Can apply to eczema below the face and neck twice daily if needed. Do not use for more than two weeks straight.  Dispense: 60 g; Refill: 3  Other general symptoms and signs -     T4, free -     TSH -     VITAMIN D  25 Hydroxy (Vit-D Deficiency, Fractures) -     Prolactin -     HEP PANEL, GENERAL  Allergic conjunctivitis of both eyes -     Olopatadine  HCl; Place 1 drop into both eyes 2 (two) times daily.  Dispense: 5 mL; Refill: 12     Getting Answers and Following Up  Simple Questions & Concerns: For quick questions or basic follow-up after your visit, reach us  at [phone] or MyChart messaging. Complex Concerns: If your concern is more complex, scheduling an appointment might be best. Discuss this with the staff to find the most suitable option. Lab & Imaging Results: We'll contact you directly if results are abnormal or you don't use MyChart. Most normal results will be on MyChart within 2-3 business days, with a review message from Dr. Jesus. Haven't heard back in 2 weeks? Need results sooner? Contact us  at [phone]. Referrals: Our referral coordinator will manage specialist referrals. The specialist's office should contact you within 2 weeks to schedule an appointment. Call us  if you haven't heard from them after 2 weeks.  Staying Connected  MyChart: Activate your MyChart for the fastest way to access results and message us . See the last page of this paperwork for instructions.  Billing  X-ray &  Lab Orders: These are billed by separate companies. Contact the invoicing company directly for questions or concerns. Visit Charges: Discuss any billing inquiries with our administrative services team.  Feedback & Satisfaction  Share Your Experience: We strive for your satisfaction! If you have any complaints, please let Dr. Jesus know directly or contact our Practice Administrators, Manuelita Rubin or [employer], by asking at the front desk.  Scheduling Tips  Shorter Wait Times: 8 am and 1 pm appointments often have the quickest wait times. Longer Appointments: If you need more time during your visit, talk to the front desk. Due to insurance regulations, multiple back-to-back appointments might be necessary.

## 2024-01-12 NOTE — Assessment & Plan Note (Signed)
 History of Iron Deficiency Anemia Monitoring for recurrence. Given the patient's history of anemia and presenting symptom of significant fatigue, re-evaluation is medically necessary to rule out recurrence as a contributing factor. This is being assessed with the ordered CBC and iron panel.

## 2024-01-12 NOTE — Assessment & Plan Note (Signed)
 Plan to follow up after lab results are available. Advise considering moving to a new apartment when the lease is up in January due to potential mold exposure impact on health. Follow up in one to two months after lab results to discuss findings and potential mold exposure impact.

## 2024-01-12 NOTE — Assessment & Plan Note (Signed)
 Fatigue, Unspecified Undiagnosed new problem with uncertain prognosis. Patient presents with persistent fatigue and drowsiness for 3 months, now interfering with rest and causing unintentional daytime somnolence. The differential diagnosis is broad and includes iron deficiency anemia, hypothyroidism, vitamin D  deficiency, and sleep disturbance secondary to other conditions. An initial workup is required to determine the underlying etiology. Plan includes ordering a CBC, comprehensive iron panel (ferritin, TIBC, serum iron), CMP, TSH with free T4, and a 25-hydroxy Vitamin D  level.

## 2024-01-12 NOTE — Assessment & Plan Note (Signed)
 Atopic Dermatitis Chronic condition, not at goal. Patient has diffuse, persistent eczema with an inadequate response to prior Dupixent and current high-potency topical steroids (clobetasol , triamcinolone ). Associated pruritus is severe enough to disrupt sleep. The condition has a cyclical exacerbation premenstrually and is likely aggravated by environmental factors. Prescription drug management involves continuing clobetasol  and triamcinolone  with monitoring for efficacy and adverse effects. Ordered Total IgE to assess atopic burden.

## 2024-01-13 LAB — ANA W/REFLEX IF POSITIVE: Anti Nuclear Antibody (ANA): NEGATIVE

## 2024-01-13 LAB — HEP PANEL, GENERAL
Hep B Core Total Ab: NONREACTIVE
Hep B S Ab: REACTIVE — AB
Hepatitis A AB,Total: NONREACTIVE
Hepatitis B Surface Ag: NONREACTIVE
Hepatitis C Ab: NONREACTIVE

## 2024-01-13 LAB — IGE: IgE (Immunoglobulin E), Serum: 1725 kU/L — ABNORMAL HIGH (ref <=114)

## 2024-01-13 LAB — IRON,TIBC AND FERRITIN PANEL
%SAT: 33 % (ref 16–45)
Ferritin: 40 ng/mL (ref 16–154)
Iron: 114 ug/dL (ref 40–190)
TIBC: 350 ug/dL (ref 250–450)

## 2024-01-13 LAB — PROLACTIN: Prolactin: 5.1 ng/mL

## 2024-01-15 ENCOUNTER — Ambulatory Visit: Payer: Self-pay | Admitting: Internal Medicine

## 2024-01-15 ENCOUNTER — Encounter: Payer: Self-pay | Admitting: Internal Medicine

## 2024-01-15 DIAGNOSIS — E288 Other ovarian dysfunction: Secondary | ICD-10-CM | POA: Insufficient documentation

## 2024-01-15 DIAGNOSIS — E559 Vitamin D deficiency, unspecified: Secondary | ICD-10-CM | POA: Insufficient documentation

## 2024-01-15 DIAGNOSIS — H1010 Acute atopic conjunctivitis, unspecified eye: Secondary | ICD-10-CM | POA: Insufficient documentation

## 2024-01-19 ENCOUNTER — Telehealth: Payer: Self-pay

## 2024-01-19 NOTE — Telephone Encounter (Signed)
 Tried to call pt no answer left voice mail for pt to call office back about lab results.

## 2024-01-19 NOTE — Telephone Encounter (Signed)
 Copied from CRM [id number]. Topic: Clinical - Lab/Test Results >> Jan 19, 2024 11:09 AM [place] J wrote: Reason for CRM: Lab results were successfully relayed to patient and her 4 week follow-up has been scheduled.  noted

## 2024-02-05 ENCOUNTER — Ambulatory Visit: Admitting: Internal Medicine

## 2024-02-20 ENCOUNTER — Telehealth: Admitting: Internal Medicine

## 2024-02-20 ENCOUNTER — Encounter: Admitting: Internal Medicine

## 2024-02-20 ENCOUNTER — Encounter: Payer: Self-pay | Admitting: Internal Medicine

## 2024-02-20 NOTE — Progress Notes (Signed)
 Unreachable for [employer] Visit (Video Appointment)
